# Patient Record
Sex: Male | Born: 1955 | Race: White | Hispanic: No | Marital: Single | Smoking: Current every day smoker
Health system: Southern US, Community
[De-identification: ages and names within clinical notes are randomized; demographics above are authoritative.]

## PROBLEM LIST (undated history)

## (undated) DIAGNOSIS — N179 Acute kidney failure, unspecified: Secondary | ICD-10-CM

## (undated) DIAGNOSIS — M549 Dorsalgia, unspecified: Secondary | ICD-10-CM

## (undated) HISTORY — PX: LEG SURGERY: SHX1003

---

## 2007-11-12 ENCOUNTER — Emergency Department (HOSPITAL_COMMUNITY): Admission: EM | Admit: 2007-11-12 | Discharge: 2007-11-12 | Payer: Self-pay | Admitting: Emergency Medicine

## 2008-05-24 ENCOUNTER — Encounter: Admission: RE | Admit: 2008-05-24 | Discharge: 2008-05-24 | Payer: Self-pay | Admitting: Internal Medicine

## 2012-05-02 ENCOUNTER — Emergency Department (HOSPITAL_COMMUNITY)
Admission: EM | Admit: 2012-05-02 | Discharge: 2012-05-02 | Disposition: A | Discharge status: Home / Self Care | Payer: Medicare Other | Attending: Emergency Medicine | Admitting: Emergency Medicine

## 2012-05-02 ENCOUNTER — Encounter (HOSPITAL_COMMUNITY): Payer: Self-pay | Admitting: Emergency Medicine

## 2012-05-02 ENCOUNTER — Emergency Department (HOSPITAL_COMMUNITY): Payer: Medicare Other

## 2012-05-02 DIAGNOSIS — X58XXXA Exposure to other specified factors, initial encounter: Secondary | ICD-10-CM | POA: Insufficient documentation

## 2012-05-02 DIAGNOSIS — Y93H2 Activity, gardening and landscaping: Secondary | ICD-10-CM | POA: Insufficient documentation

## 2012-05-02 DIAGNOSIS — F17200 Nicotine dependence, unspecified, uncomplicated: Secondary | ICD-10-CM | POA: Insufficient documentation

## 2012-05-02 DIAGNOSIS — M79609 Pain in unspecified limb: Secondary | ICD-10-CM | POA: Insufficient documentation

## 2012-05-02 DIAGNOSIS — M7989 Other specified soft tissue disorders: Secondary | ICD-10-CM

## 2012-05-02 DIAGNOSIS — M79606 Pain in leg, unspecified: Secondary | ICD-10-CM

## 2012-05-02 HISTORY — DX: Dorsalgia, unspecified: M54.9

## 2012-05-02 MED ORDER — HYDROCODONE-ACETAMINOPHEN 5-500 MG PO TABS: 1.0000 | ORAL_TABLET | Freq: Four times a day (QID) | ORAL | Status: AC | PRN

## 2012-05-02 NOTE — ED Notes (Signed)
Patient transported to X-ray 

## 2012-05-02 NOTE — ED Provider Notes (Signed)
History  Scribed for Geoffery Lyons, MD, the patient was seen in room TR06C/TR06C. This chart was scribed by Candelaria Stagers. The patient's care started at 12:21 PM   CSN: [id number]  Arrival date & time 05/02/12  1039   First MD Initiated Contact with Patient 05/02/12 1220      Chief Complaint  Patient presents with  . Leg Injury     The history is provided by the patient. No language interpreter was used.   Orvill Coulthard is a 56 y.o. male who presents to the Emergency Department complaining of left ankle pain that radiates up the lower leg that started after trimming trees yesterday.  He states that he didn't fall and is uncertain that a branch fell on it.  He is experiencing ankle swelling.  Walking and bearing weight makes the pain worse.   Past Medical History  Diagnosis Date  . Back pain     History reviewed. No pertinent past surgical history.  No family history on file.  History  Substance Use Topics  . Smoking status: Current Everyday Smoker  . Smokeless tobacco: Not on file  . Alcohol Use: Yes      Review of Systems  Musculoskeletal: Positive for arthralgias (left ankle pain).  Skin: Negative for wound.  All other systems reviewed and are negative.    Allergies  Penicillins  Home Medications   Current Outpatient Rx  Name Route Sig Dispense Refill  . ALPRAZOLAM 0.5 MG PO TABS Oral Take 0.5 mg by mouth at bedtime.     . CYCLOBENZAPRINE HCL 10 MG PO TABS Oral Take 10 mg by mouth 2 (two) times daily as needed. For muscle spasms.    . MELOXICAM 15 MG PO TABS Oral Take 15 mg by mouth daily as needed. For pain    . OXYMORPHONE HCL ER 40 MG PO TB12 Oral Take 40 mg by mouth every evening.    Marland Kitchen OXYMORPHONE HCL 10 MG PO TABS Oral Take 10 mg by mouth every 4 (four) hours as needed. For pain    . QUETIAPINE FUMARATE 200 MG PO TABS Oral Take 200 mg by mouth at bedtime.      BP 120/90  Pulse 99  Temp 98.3 F (36.8 C) (Oral)  Resp 22  SpO2 97%  Physical Exam    Nursing note and vitals reviewed. Constitutional: He is oriented to person, place, and time. He appears well-developed and well-nourished. No distress.  HENT:  Head: Normocephalic and atraumatic.  Eyes: EOM are normal. Pupils are equal, round, and reactive to light.  Neck: Neck supple. No tracheal deviation present.  Pulmonary/Chest: Effort normal. No respiratory distress.  Musculoskeletal: Normal range of motion. He exhibits no edema.       Swelling of the left ankle and left calf with no ecchymosis.  DP and cap refill intact.    Neurological: He is alert and oriented to person, place, and time.  Skin: Skin is warm and dry.  Psychiatric: He has a normal mood and affect. His behavior is normal.    ED Course  Procedures   DIAGNOSTIC STUDIES: Oxygen Saturation is 97% on room air, normal by my interpretation.    COORDINATION OF CARE:  12:29 Ordered: US Venous Img Lower Unilateral Left 10:57 Ordered: DG Ankle Complete Left    Labs Reviewed - No data to display Dg Ankle Complete Left  05/02/2012  *RADIOLOGY REPORT*  Clinical Data: Left foot pain, and swelling, injury at work  LEFT ANKLE COMPLETE - 3+  VIEW  Comparison: None.  Findings: Diffuse soft tissue swelling about the ankle slightly worse adjacent to the medial malleolus that the lateral.  No evidence of acute fracture, or malalignment.  The ankle mortise remains congruent.  A small ankle joint effusion is present and there is infiltration of Kager's fat pad.  Atherosclerotic vascular calcifications identified in the posterior tibial artery.  IMPRESSION:  1.  Soft tissue swelling about the ankle, and a small ankle joint effusion without evidence of acute fracture, or malalignment.  2.  Atherosclerotic vascular calcifications in the posterior tibial artery distributions.   Original Report Authenticated By: Vilma Prader      No diagnosis found.    MDM  The patient presents with pain and swelling to his left ankle and calf.  He tells me  he was helping a friend trim some trees and believes he may have injured it then.  On exam, the ankle is swollen but distal pulses, sensation, and motor are intact.  The lack of a specific trauma to this area makes me consider dvt in the differential and performed an ultrasound.  This showed only a Baker's cyst but no clot.  I doubt this is the cause of his swelling.  I suspect he aggravated the ankle performing the work he did yesterday and will treat with rest, pain medications, and prn followup if not improving.     I personally performed the services described in this documentation, which was scribed in my presence. The recorded information has been reviewed and considered.         Geoffery Lyons, MD 05/03/12 [phone]

## 2012-05-02 NOTE — ED Notes (Signed)
Patient reports onset of swelling to the left lower extremity x 2 days.  Patient has noted warmth to the extremity,  Swelling noted from the knee to the foot.  Cap refill less than 3,  Contusion noted to the inner foot and outer ankle.  Patient denies sob, denies chest pain

## 2012-05-02 NOTE — Progress Notes (Signed)
VASCULAR LAB PRELIMINARY  PRELIMINARY  PRELIMINARY  PRELIMINARY  Left lower extremity venous Doppler completed.    Preliminary report:  There is no obvious evidence of DVT or SVT noted in the left lower extremity.  There is a moderate to large, intact,  Baker's Cyst noted in the left popliteal fossa.   Stanton Kissoon, 05/02/2012, 2:06 PM

## 2012-05-02 NOTE — ED Notes (Signed)
Pt. Stated, I've had leg and foot pain.  Foot pain from playing with grand, and leg injury helping  Friend.

## 2012-12-17 ENCOUNTER — Encounter (HOSPITAL_COMMUNITY): Payer: Self-pay | Admitting: *Deleted

## 2012-12-17 ENCOUNTER — Emergency Department (INDEPENDENT_AMBULATORY_CARE_PROVIDER_SITE_OTHER)
Admission: EM | Admit: 2012-12-17 | Discharge: 2012-12-17 | Disposition: A | Discharge status: Home / Self Care | Payer: Worker's Compensation | Source: Home / Self Care | Attending: Family Medicine | Admitting: Family Medicine

## 2012-12-17 DIAGNOSIS — M545 Low back pain, unspecified: Secondary | ICD-10-CM

## 2012-12-17 DIAGNOSIS — G8929 Other chronic pain: Secondary | ICD-10-CM

## 2012-12-17 MED ORDER — OXYMORPHONE HCL ER 10 MG PO TB12: 10.0000 mg | ORAL_TABLET | Freq: Two times a day (BID) | ORAL | Status: DC

## 2012-12-17 NOTE — ED Notes (Signed)
Pt   States  He     Goes  To  A  Pain  Control  Clinic  In  [place]           He  States  He lives  In  [place]  He  States  He  Is  Almost  Out  Of  His  Pain pills          He  Reports  Chronic          Back  Pain       He  Ambulated  To  Room  With a  Steady  Fluid  Gait

## 2012-12-17 NOTE — ED Provider Notes (Signed)
History     CSN: [id number]  Arrival date & time 12/17/12  1259   First MD Initiated Contact with Patient 12/17/12 1314      Chief Complaint  Patient presents with  . Back Pain    (Consider location/radiation/quality/duration/timing/severity/associated sxs/prior treatment) Patient is a 57 y.o. male presenting with back pain. The history is provided by the patient.  Back Pain Location:  Lumbar spine Quality:  Aching Radiates to:  Does not radiate Pain severity:  Moderate Chronicity:  Chronic Context comment:  Reports back injury in 2006 with chronic pain since.   Past Medical History  Diagnosis Date  . Back pain     History reviewed. No pertinent past surgical history.  No family history on file.  History  Substance Use Topics  . Smoking status: Current Every Day Smoker  . Smokeless tobacco: Not on file  . Alcohol Use: Yes      Review of Systems  Constitutional: Negative.   Gastrointestinal: Negative.   Genitourinary: Negative for difficulty urinating.  Musculoskeletal: Positive for back pain. Negative for gait problem.    Allergies  Penicillins  Home Medications   Current Outpatient Rx  Name  Route  Sig  Dispense  Refill  . ALPRAZolam (XANAX) 0.5 MG tablet   Oral   Take 0.5 mg by mouth at bedtime.          . cyclobenzaprine (FLEXERIL) 10 MG tablet   Oral   Take 10 mg by mouth 2 (two) times daily as needed. For muscle spasms.         . meloxicam (MOBIC) 15 MG tablet   Oral   Take 15 mg by mouth daily as needed. For pain         . oxymorphone (OPANA ER) 10 MG 12 hr tablet   Oral   Take 1 tablet (10 mg total) by mouth every 12 (twelve) hours. For pain   10 tablet   0   . oxymorphone (OPANA ER) 40 MG 12 hr tablet   Oral   Take 40 mg by mouth every evening.         Marland Kitchen oxymorphone (OPANA) 10 MG tablet   Oral   Take 10 mg by mouth every 4 (four) hours as needed. For pain         . QUEtiapine (SEROQUEL) 200 MG tablet   Oral   Take  200 mg by mouth at bedtime.           BP 155/93  Pulse 90  Temp(Src) 98.3 F (36.8 C) (Oral)  Resp 18  SpO2 99%  Physical Exam  Nursing note and vitals reviewed. Constitutional: He is oriented to person, place, and time. He appears well-developed and well-nourished.  Musculoskeletal: He exhibits no edema.  Neg slr.  Neurological: He is alert and oriented to person, place, and time.  Skin: Skin is warm and dry.    ED Course  Procedures (including critical care time)  Labs Reviewed - No data to display No results found.   1. Chronic lower back pain       MDM          Linna Hoff, MD 12/17/12 1723

## 2012-12-27 ENCOUNTER — Encounter (HOSPITAL_COMMUNITY): Payer: Self-pay

## 2012-12-27 ENCOUNTER — Emergency Department (INDEPENDENT_AMBULATORY_CARE_PROVIDER_SITE_OTHER)
Admission: EM | Admit: 2012-12-27 | Discharge: 2012-12-27 | Disposition: A | Discharge status: Home / Self Care | Payer: Medicare Other | Source: Home / Self Care

## 2012-12-27 DIAGNOSIS — M549 Dorsalgia, unspecified: Secondary | ICD-10-CM

## 2012-12-27 DIAGNOSIS — G8929 Other chronic pain: Secondary | ICD-10-CM

## 2012-12-27 LAB — RAPID URINE DRUG SCREEN, HOSP PERFORMED
Amphetamines: NOT DETECTED
Opiates: NOT DETECTED

## 2012-12-27 NOTE — ED Provider Notes (Signed)
History     CSN: [id number]  Arrival date & time 12/27/12  1700   First MD Initiated Contact with Patient 12/27/12 1708      Chief Complaint  Patient presents with  . Back Pain    HPI 57 year old male with chronic back pain over his lumbar and cervical spine since 2006 following a fall and injuring his back presents to establish new care. Patient was previously followed at [facility] pain clinic in [place] and was discharged from the service as he breached the pain contract in February and given a 1 month supply of medications.  Patient informs having continuous low back and neck pain. He denies radiation of the pain or bowel or urinary symptoms. Denies any fever, chills, headache, blurred vision, chest pain, palpitations, shortness of breath, abdominal pain, nausea or vomiting.  Past Medical History  Diagnosis Date  . Back pain     History reviewed. No pertinent past surgical history.  No family history on file.  History  Substance Use Topics  . Smoking status: Current Every Day Smoker  . Smokeless tobacco: Not on file  . Alcohol Use: Yes      Review of Systems As outlined in history of present illness Allergies  Penicillins  Home Medications   Current Outpatient Rx  Name  Route  Sig  Dispense  Refill  . tadalafil (CIALIS) 20 MG tablet   Oral   Take 20 mg by mouth daily as needed for erectile dysfunction.         Marland Kitchen ALPRAZolam (XANAX) 0.5 MG tablet   Oral   Take 1 mg by mouth 3 (three) times daily as needed.          . cyclobenzaprine (FLEXERIL) 10 MG tablet   Oral   Take 10 mg by mouth 2 (two) times daily as needed. For muscle spasms.         . meloxicam (MOBIC) 15 MG tablet   Oral   Take 15 mg by mouth daily as needed. For pain         . oxymorphone (OPANA ER) 10 MG 12 hr tablet   Oral   Take 1 tablet (10 mg total) by mouth every 12 (twelve) hours. For pain   10 tablet   0   . oxymorphone (OPANA ER) 40 MG 12 hr tablet   Oral   Take  40 mg by mouth every evening.         Marland Kitchen oxymorphone (OPANA) 10 MG tablet   Oral   Take 10 mg by mouth every 4 (four) hours as needed. For pain         . QUEtiapine (SEROQUEL) 200 MG tablet   Oral   Take 200 mg by mouth at bedtime.           BP 175/106  Pulse 105  Temp(Src) 97.7 F (36.5 C) (Oral)  Resp 18  SpO2 99%  Physical Exam Middle-aged male in no acute distress HEENT: No pallor, moist oral mucosa Chest: Clear to auscultation bilaterally, no added sounds CVS: Normal S1 and S2 Abdomen: Soft, nontender, nondistended,  Extremities: Warm, tender over the lower back to palpation, normal range of motion CNS: The aorta is 3 ED Course  Procedures (including critical care time)  Labs Reviewed  URINE RAPID DRUG SCREEN (HOSP PERFORMED)   No results found.   1. Chronic back pain       MDM  Follow up tomorrow with results of urine drug screen. If positive  I will give him a two-week course of the narcotics.  Have made a referral to the adult pain management clinic Counseled strongly on smoking cessation.        Eddie North, MD 12/27/12 1749

## 2012-12-27 NOTE — ED Notes (Signed)
Patient states  Broke his back in 2006 Suffers from chronic back pain

## 2012-12-28 ENCOUNTER — Emergency Department (INDEPENDENT_AMBULATORY_CARE_PROVIDER_SITE_OTHER)
Admission: EM | Admit: 2012-12-28 | Discharge: 2012-12-28 | Disposition: A | Discharge status: Home / Self Care | Payer: Medicare Other | Source: Home / Self Care

## 2012-12-28 ENCOUNTER — Encounter (HOSPITAL_COMMUNITY): Payer: Self-pay

## 2012-12-28 DIAGNOSIS — M545 Low back pain: Secondary | ICD-10-CM

## 2012-12-28 MED ORDER — OXYMORPHONE HCL ER 40 MG PO TB12: 40.0000 mg | ORAL_TABLET | Freq: Every evening | ORAL | Status: DC

## 2012-12-28 MED ORDER — OXYMORPHONE HCL 10 MG PO TABS: 10.0000 mg | ORAL_TABLET | Freq: Four times a day (QID) | ORAL | Status: DC | PRN

## 2012-12-28 NOTE — ED Notes (Signed)
Referral faxed to [facility] for chronic back pain

## 2012-12-28 NOTE — Progress Notes (Signed)
UDS reviewed and was negative for opiates. . Patient unsure when he last took the pain meds.  i will prescribe him with a 2 week supply of oxymorphone 40 mg ER bid and oxymorphone 10 mg IR q 6 hr prn for pain.  referral to pain management clinic has been sent.

## 2012-12-28 NOTE — ED Notes (Signed)
Patient here to get results of urine drug screen

## 2013-01-31 ENCOUNTER — Encounter: Payer: Self-pay | Admitting: Family Medicine

## 2013-01-31 ENCOUNTER — Ambulatory Visit: Payer: Medicare Other | Attending: Family Medicine | Admitting: Family Medicine

## 2013-01-31 VITALS — BP 112/78 | HR 63 | Temp 98.0°F | Resp 14 | Ht 75.0 in | Wt 192.0 lb

## 2013-01-31 DIAGNOSIS — G8929 Other chronic pain: Secondary | ICD-10-CM

## 2013-01-31 DIAGNOSIS — M549 Dorsalgia, unspecified: Secondary | ICD-10-CM

## 2013-01-31 NOTE — Patient Instructions (Signed)
*   [facility] (helps with withdrawal): [address] [place], [place], [place] [address] [phone]

## 2013-01-31 NOTE — Progress Notes (Signed)
Subjective:     Patient ID: Dale [name], male   DOB: 1956/04/04, 57 y.o.   MRN: [id number]  HPI Pt here for refills on narcotics for his chronic back pain. Per notes, he was discharged 10/08/12 from a pain management clinic in [place] due to breaching the contract. At that time he was given a one month supply of medication. Last month he was referred to a pain clinic here and given a refill on the narcotics. He has not heard anything about the referral and plans on having his narcotics filled here.  He continues to have chronic back pain - 10/10 and constant per him. No loss of bladder/bowel function or other neuro deficits. No new sx.    Review of Systems per hpi     Objective:   Physical Exampt declined PE     Assessment:     Chronic back pain - Plan: Ambulatory referral to Pain Clinic       Plan:     Explained that, as told to him previously, we do not manage chronic pain here and we will not prescribe chronic narcotics here. I have re-placed the pain clinic referral as I do not see where the pt was either scheduled or denied.  Have given the pt information on [facility] to help him if he experiences withdrawal sx. Emphasized we are more than happy to have him as a pt here and to manage his other health and wellness needs, but we will not be managing the chronic pain or prescribing pain meds. Pt verbalized unhappines with this information, however he did verbalize understanding.    Should he experience severe w/d sx he should go to the ED. Should he still like to be our patient for other health and wellness issues, he should f/u in 1-2 mos to discuss other health issues as well as for health maintanance screenings. He will call with any concerns or questions.

## 2013-01-31 NOTE — Progress Notes (Signed)
Patient states he is here for medication refill for oxymorphone due to back and shoulder pain; explained that he would need to be referred to pain management for more pain medication.

## 2013-03-06 ENCOUNTER — Emergency Department (HOSPITAL_COMMUNITY): Payer: Medicare Other

## 2013-03-06 ENCOUNTER — Encounter (HOSPITAL_COMMUNITY): Payer: Self-pay | Admitting: *Deleted

## 2013-03-06 ENCOUNTER — Emergency Department (EMERGENCY_DEPARTMENT_HOSPITAL)
Admission: EM | Admit: 2013-03-06 | Discharge: 2013-03-06 | Disposition: A | Discharge status: Home / Self Care | Payer: Medicare Other | Source: Home / Self Care | Attending: Emergency Medicine | Admitting: Emergency Medicine

## 2013-03-06 DIAGNOSIS — F1910 Other psychoactive substance abuse, uncomplicated: Secondary | ICD-10-CM

## 2013-03-06 DIAGNOSIS — R9089 Other abnormal findings on diagnostic imaging of central nervous system: Secondary | ICD-10-CM

## 2013-03-06 DIAGNOSIS — R930 Abnormal findings on diagnostic imaging of skull and head, not elsewhere classified: Secondary | ICD-10-CM

## 2013-03-06 DIAGNOSIS — R4182 Altered mental status, unspecified: Secondary | ICD-10-CM

## 2013-03-06 LAB — CBC
Hemoglobin: 14.1 g/dL (ref 13.0–17.0)
MCH: 31.9 pg (ref 26.0–34.0)
MCV: 93 fL (ref 78.0–100.0)
Platelets: 174 10*3/uL (ref 150–400)
RBC: 4.42 MIL/uL (ref 4.22–5.81)
WBC: 6.2 10*3/uL (ref 4.0–10.5)

## 2013-03-06 LAB — COMPREHENSIVE METABOLIC PANEL
ALT: 40 U/L (ref 0–53)
AST: 103 U/L — ABNORMAL HIGH (ref 0–37)
CO2: 25 mEq/L (ref 19–32)
Chloride: 102 mEq/L (ref 96–112)
Creatinine, Ser: 1.03 mg/dL (ref 0.50–1.35)
GFR calc Af Amer: >90 mL/min (ref >90)
GFR calc non Af Amer: 79 mL/min — ABNORMAL LOW (ref >90)
Glucose, Bld: 104 mg/dL — ABNORMAL HIGH (ref 70–99)
Sodium: 141 mEq/L (ref 135–145)
Total Bilirubin: 0.9 mg/dL (ref 0.3–1.2)

## 2013-03-06 LAB — URINALYSIS, ROUTINE W REFLEX MICROSCOPIC
Hgb urine dipstick: NEGATIVE
Nitrite: NEGATIVE
Protein, ur: NEGATIVE mg/dL
Urobilinogen, UA: 1 mg/dL (ref 0.0–1.0)

## 2013-03-06 LAB — RAPID URINE DRUG SCREEN, HOSP PERFORMED
Amphetamines: NOT DETECTED
Barbiturates: NOT DETECTED
Benzodiazepines: NOT DETECTED
Cocaine: NOT DETECTED
Tetrahydrocannabinol: NOT DETECTED

## 2013-03-06 NOTE — ED Notes (Signed)
Pt taken to MRI  

## 2013-03-06 NOTE — ED Provider Notes (Signed)
History    CSN: [id number] Arrival date & time 03/06/13  4098  First MD Initiated Contact with Patient 03/06/13 [phone]     Chief Complaint  Patient presents with  . Altered Mental Status   (Consider location/radiation/quality/duration/timing/severity/associated sxs/prior Treatment) HPI Comments: 57 y/o comes in with cc of AMS. Pt has hx of depression? On seroquel and chronic back pain on opana. Pt was discharged from the pain clinic, and has been taking his opana, rationing it, weaning himself off of it. Last use was 2 weeks ago. Pt is now having visual and auditory hallucinations, and sometimes behaving bizarrely - and family endorses those symptoms. No SI/HI. No hx of same  Patient is a 57 y.o. male presenting with altered mental status. The history is provided by the patient.  Altered Mental Status Associated symptoms: hallucinations   Associated symptoms: no abdominal pain    Past Medical History  Diagnosis Date  . Back pain    History reviewed. No pertinent past surgical history. No family history on file. History  Substance Use Topics  . Smoking status: Current Every Day Smoker  . Smokeless tobacco: Not on file  . Alcohol Use: Yes    Review of Systems  Constitutional: Negative for activity change and appetite change.  Respiratory: Negative for cough and shortness of breath.   Cardiovascular: Negative for chest pain.  Gastrointestinal: Negative for abdominal pain.  Genitourinary: Negative for dysuria.  Psychiatric/Behavioral: Positive for hallucinations and altered mental status. Negative for suicidal ideas and self-injury.    Allergies  Penicillins  Home Medications   Current Outpatient Rx  Name  Route  Sig  Dispense  Refill  . ALPRAZolam (XANAX) 0.5 MG tablet   Oral   Take 1 mg by mouth 3 (three) times daily as needed.          . cyclobenzaprine (FLEXERIL) 10 MG tablet   Oral   Take 10 mg by mouth 2 (two) times daily as needed. For muscle spasms.         . meloxicam (MOBIC) 15 MG tablet   Oral   Take 15 mg by mouth daily as needed. For pain         . oxymorphone (OPANA ER) 10 MG 12 hr tablet   Oral   Take 1 tablet (10 mg total) by mouth every 12 (twelve) hours. For pain   10 tablet   0   . oxymorphone (OPANA ER) 40 MG 12 hr tablet   Oral   Take 1 tablet (40 mg total) by mouth every evening.   30 tablet   0   . oxymorphone (OPANA) 10 MG tablet   Oral   Take 1 tablet (10 mg total) by mouth every 6 (six) hours as needed for pain.   60 tablet   0   . QUEtiapine (SEROQUEL) 200 MG tablet   Oral   Take 200 mg by mouth at bedtime.         . tadalafil (CIALIS) 20 MG tablet   Oral   Take 20 mg by mouth daily as needed for erectile dysfunction.          BP 136/87  Pulse 89  Temp(Src) 98.3 F (36.8 C) (Oral)  Resp 17  SpO2 99% Physical Exam  Nursing note and vitals reviewed. Constitutional: He appears well-developed.  HENT:  Head: Normocephalic and atraumatic.  Eyes: Conjunctivae and EOM are normal. Pupils are equal, round, and reactive to light.  Neck: Normal range of motion. Neck  supple.  Cardiovascular: Normal rate and regular rhythm.   Pulmonary/Chest: Effort normal and breath sounds normal.  Abdominal: Soft. Bowel sounds are normal. He exhibits no distension. There is no tenderness. There is no rebound and no guarding.  Neurological: He is alert.  Cerebellar exam is normal (finger to nose) Sensory exam normal for bilateral upper and lower extremities - and patient is able to discriminate between sharp and dull. Motor exam is 4+/5   Skin: Skin is warm.  Psychiatric: He has a normal mood and affect. His behavior is normal. Judgment and thought content normal.    ED Course  Procedures (including critical care time) Labs Reviewed  COMPREHENSIVE METABOLIC PANEL - Abnormal; Notable for the following:    Glucose, Bld 104 (*)    BUN 29 (*)    AST 103 (*)    GFR calc non Af Amer 79 (*)    All other  components within normal limits  URINALYSIS, ROUTINE W REFLEX MICROSCOPIC - Abnormal; Notable for the following:    APPearance HAZY (*)    Bilirubin Urine SMALL (*)    All other components within normal limits  GLUCOSE, CAPILLARY - Abnormal; Notable for the following:    Glucose-Capillary 101 (*)    All other components within normal limits  CBC  URINE RAPID DRUG SCREEN (HOSP PERFORMED)  POCT I-STAT TROPONIN I   Ct Head Wo Contrast  03/06/2013   *RADIOLOGY REPORT*  Clinical Data: Confusion.  CT HEAD WITHOUT CONTRAST  Technique:  Contiguous axial images were obtained from the base of the skull through the vertex without contrast.  Comparison: None.  Findings: No intracranial hemorrhage.  No hydrocephalus.  7 mm hypodensity right frontal lobe.  Etiology indeterminate.  This may be related to prior ischemia, small acute infarct or partial volume averaging with adjacent sulcus.  Mass felt to be less likely consideration.  If there are persistent/progressive symptoms this can be further evaluated with MR.  Tiny radiopaque structures felt to be related to artifact.  Carotid artery calcifications.  Partial opacification inferior aspect right mastoid air cells.  Visualized orbital structures unremarkable.  IMPRESSION: 7 mm hypodensity right frontal lobe.  Etiology indeterminate.  This may be related to prior ischemia, small acute infarct or partial volume averaging with adjacent sulcus.  Mass felt to be less likely consideration.  If there are persistent/progressive symptoms this can be further evaluated with MR.  Partial opacification inferior aspect right mastoid air cells.   Original Report Authenticated By: Lacy Duverney, M.D.   No diagnosis found.  MDM  Pt comes in with some psychoses. No hx of psychotic condition. Opana stopped 2 weeks ago dont suspect that as the cause. No alcohol abuse. CT showed hypodense area, MRI shows no acute pathology. Neuro and psych were recommended, and when cleared,  patient discharged.   Derwood Kaplan, MD 03/08/13 [phone]

## 2013-03-06 NOTE — ED Notes (Signed)
Pt has been taking opana (large doses), flexeril, xanax for chronic back pain x 7 yrs. Was released from a pain clinic in [place] back in May & has been unable to get into another pain management clinic. Last opana dose approx week ago.  Has been receiving prescriptions of xanax & flexeril from psychiatrist, Dr. Darlys Gales. Family & pt reports confusion, acting strange, auditory & visual hallucinations past week. States they are all people that he knows, such as friends or family. Has not informed Dr. Darlys Gales of hallucinations. Denies HI, SI.  Pt very restless, anxious, jittery.

## 2013-03-06 NOTE — ED Notes (Signed)
Consulting MD, Dr. Amada Jupiter at bedside.

## 2013-03-06 NOTE — ED Notes (Signed)
Diet tray ordered 

## 2013-03-06 NOTE — ED Provider Notes (Signed)
8:52 PM The patient was seen and evaluated by psychiatry who believes the patient is stable from a mental health standpoint.  He does not believe the patient is a threat to himself or others.  MRI is without acute abnormality.  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  Discharge home in good condition.  Lyanne Co, MD 03/06/13 2053

## 2013-03-06 NOTE — ED Notes (Signed)
Ordered a Reg tray

## 2013-03-06 NOTE — ED Notes (Signed)
Patient transported to CT 

## 2013-03-06 NOTE — ED Notes (Signed)
Pt is here with confusion and seems anxious.  Pt has been having trouble with speech and stuttering.  Pt was on pain meds for broken back and then was unable to get medications.  Pt went to Dr. Darlys Gales a psychiatrist.  Confusing dreams with reality and seeing things that are not there.  Pt has been off the pain medication for 2-3 weeks.   Pt was taken in by police yesterday because they thought he was under the influence, but he was not per brother.  Pt knows it is 2014 and thought it was Monday.

## 2013-03-06 NOTE — Consult Note (Signed)
Reason for Consult: Confusion with an abnormal head CT Referring Physician: Dr Patria Mane  CC: Confusion  HPI: Dale [name] is an 57 y.o. male who suffered a back injury as the result of a fall in 2006. He has had chronic back pain since that time which has required significant amounts of narcotic pain medications. The patient was followed by the [facility] pain clinic in [place] until just recently when they dismissed him from the practice secondary to a disagreement regarding his medications. The patient lives alone and is normally independent and drives. He is assisted occasionally by his sister and her husband. They have not seen the patient until  2 days ago. At that time he appeared to be very anxious, confused, and had difficulty speaking. The patient stated that he ran out of his Opana a week ago and has had no pain medications since that time. He also takes Flexeril, Mobic, Xanax, Cialis, and Seroquel for sleep. The Seroquel was prescribed by the patient's psychiatrist. The patient was brought to the emergency department at [facility] today by his sister and her husband when they realized that the patient was having visual and auditory hallucinations. A CT scan was performed that revealed a 7 mm hypodensity in the right frontal lobe the etiology of which was unclear. A neurology consult was requested for further evaluation.  He seems slightly less "anxious" today.   Past Medical History  Diagnosis Date  . Back pain     History reviewed. No pertinent past surgical history.  No family history on file.  Social History:  The patient smokes approximately 3/4 of a pack cigarettes per day. He usually has 2-3 beers for other alcoholic drinks per day. He drives and lives independently.  Allergies  Allergen Reactions  . Penicillins Hives    Medications: No current facility-administered medications for this encounter. Current outpatient prescriptions:ALPRAZolam (XANAX) 0.5 MG  tablet, Take 1 mg by mouth 3 (three) times daily as needed. , Disp: , Rfl: ;  cyclobenzaprine (FLEXERIL) 10 MG tablet, Take 10 mg by mouth 2 (two) times daily as needed. For muscle spasms., Disp: , Rfl: ;  meloxicam (MOBIC) 15 MG tablet, Take 15 mg by mouth daily as needed. For pain, Disp: , Rfl:  oxymorphone (OPANA ER) 10 MG 12 hr tablet, Take 1 tablet (10 mg total) by mouth every 12 (twelve) hours. For pain, Disp: 10 tablet, Rfl: 0;  oxymorphone (OPANA ER) 40 MG 12 hr tablet, Take 1 tablet (40 mg total) by mouth every evening., Disp: 30 tablet, Rfl: 0;  oxymorphone (OPANA) 10 MG tablet, Take 1 tablet (10 mg total) by mouth every 6 (six) hours as needed for pain., Disp: 60 tablet, Rfl: 0 QUEtiapine (SEROQUEL) 200 MG tablet, Take 200 mg by mouth at bedtime., Disp: , Rfl: ;  tadalafil (CIALIS) 20 MG tablet, Take 20 mg by mouth daily as needed for erectile dysfunction., Disp: , Rfl:   ROS: Unobtainable secondary to confusion.  Physical Examination: Blood pressure 132/91, pulse 89, temperature 98.3 F (36.8 C), temperature source Oral, resp. rate 19, SpO2 97.00%.  Neurologic Examination General - restless 58 year old male, at times mumbling, who appears uncomfortable. Heart - Regular rate and rhythm - no murmer Lungs - Clear to auscultation Abdomen - Soft - non tender Extremities - Distal pulses intact - no edema Skin - Warm and dry  Mental Status: Alert, somewhat anxious, with slightly pressure speech. Oriented with cueing. Able to follow 3 step commands without difficulty. Able to spell  world backwards without difficulty. Unable to perform serial 7s. Able to give # of quarters in $2.75 Cranial Nerves: II: Discs not visualized; Visual fields grossly normal, pupils equal, round, reactive to light and accommodation dilated. III,IV, VI: ptosis not present, extra-ocular motions intact bilaterally V,VII: smile symmetric, facial light touch sensation normal bilaterally VIII: hearing normal  bilaterally IX,X: gag reflex present XI: bilateral shoulder shrug XII: midline tongue extension Motor: Right : Upper extremity   5/5    Left:     Upper extremity   5/5  Lower extremity   5/5     Lower extremity   5/5 Tone and bulk:normal tone throughout; no atrophy noted He has significant psychomotor agitation.  Sensory: Light touch intact throughout, bilaterally Deep Tendon Reflexes: 1+ and symmetric throughout Plantars: Right: downgoing   Left: downgoing Cerebellar: Finger to nose performed with significant tremor bilaterally. Heel-to-shin with some difficulty but intact. Gait: Deferred CV: pulses palpable throughout   Laboratory Studies:   Basic Metabolic Panel:  Recent Labs Lab 03/06/13 0959  NA 141  K 3.6  CL 102  CO2 25  GLUCOSE 104*  BUN 29*  CREATININE 1.03  CALCIUM 9.6    Liver Function Tests:  Recent Labs Lab 03/06/13 0959  AST 103*  ALT 40  ALKPHOS 67  BILITOT 0.9  PROT 7.0  ALBUMIN 4.0   No results found for this basename: LIPASE, AMYLASE,  in the last 168 hours No results found for this basename: AMMONIA,  in the last 168 hours  CBC:  Recent Labs Lab 03/06/13 0959  WBC 6.2  HGB 14.1  HCT 41.1  MCV 93.0  PLT 174    Cardiac Enzymes: No results found for this basename: CKTOTAL, CKMB, CKMBINDEX, TROPONINI,  in the last 168 hours  BNP: No components found with this basename: POCBNP,   CBG:  Recent Labs Lab 03/06/13 0934  GLUCAP 101*    Microbiology: No results found for this or any previous visit.  Coagulation Studies: No results found for this basename: LABPROT, INR,  in the last 72 hours  Urinalysis:  Recent Labs Lab 03/06/13 1041  COLORURINE YELLOW  LABSPEC 1.029  PHURINE 6.0  GLUCOSEU NEGATIVE  HGBUR NEGATIVE  BILIRUBINUR SMALL*  KETONESUR NEGATIVE  PROTEINUR NEGATIVE  UROBILINOGEN 1.0  NITRITE NEGATIVE  LEUKOCYTESUR NEGATIVE    Lipid Panel:  No results found for this basename: chol, trig, hdl, cholhdl,  vldl, ldlcalc    HgbA1C:  No results found for this basename: HGBA1C    Urine Drug Screen:     Component Value Date/Time   LABOPIA NONE DETECTED 03/06/2013 1041   COCAINSCRNUR NONE DETECTED 03/06/2013 1041   LABBENZ NONE DETECTED 03/06/2013 1041   AMPHETMU NONE DETECTED 03/06/2013 1041   THCU NONE DETECTED 03/06/2013 1041   LABBARB NONE DETECTED 03/06/2013 1041    Alcohol Level: No results found for this basename: ETH,  in the last 168 hours  Other results: EKG: Sinus tachycardia rate 102 beats per minute  Imaging:  Ct Head Wo Contrast 03/06/2013  7 mm hypodensity right frontal lobe.  Etiology indeterminate.  This may be related to prior ischemia, small acute infarct or partial volume averaging with adjacent sulcus.  Mass felt to be less likely consideration.  If there are persistent/progressive symptoms this can be further evaluated with MR.  Partial opacification inferior aspect right mastoid air cells.     I have seen and evaluated the patient. I have reviewed the above note and made appropriate changes.  Assessment/Plan:  This is a 57 year old male with a history of chronic back pain and opioid dependency who was brought to the emergency department by his family for confusion, hallucinations, and difficulty with his speech. A CT scan showed a 7 mm hypodensity in the right frontal lobe. The etiology is unclear. This may simply be artifact; however, it was felt that an MRI would be required for further evaluation. It is believed at this time that the patient's current symptoms are the result of narcotic withdrawal, though he may have some alcohol abuse that he is not reporting. At this time, I feel that further evaluation of his brain lesion with MRI would be prudent, but the recent cessation of narcotics coupled with his psychomotor agitation and delusions make me think taht a withdrawal syndrome is most likely.    Ritta Slot, MD [facility] [phone]  If  7pm- 7am, please page neurology on call at [phone].

## 2013-03-08 ENCOUNTER — Encounter (HOSPITAL_COMMUNITY): Payer: Self-pay

## 2013-03-08 ENCOUNTER — Inpatient Hospital Stay (HOSPITAL_COMMUNITY)
Admission: EM | Admit: 2013-03-08 | Discharge: 2013-03-10 | DRG: 896 | Disposition: A | Discharge status: Other Acute Inpatient Hospital | Payer: Medicare Other | Attending: Internal Medicine | Admitting: Internal Medicine

## 2013-03-08 DIAGNOSIS — F17200 Nicotine dependence, unspecified, uncomplicated: Secondary | ICD-10-CM | POA: Diagnosis present

## 2013-03-08 DIAGNOSIS — Z880 Allergy status to penicillin: Secondary | ICD-10-CM

## 2013-03-08 DIAGNOSIS — Z79899 Other long term (current) drug therapy: Secondary | ICD-10-CM

## 2013-03-08 DIAGNOSIS — E440 Moderate protein-calorie malnutrition: Secondary | ICD-10-CM | POA: Diagnosis present

## 2013-03-08 DIAGNOSIS — F39 Unspecified mood [affective] disorder: Secondary | ICD-10-CM | POA: Diagnosis present

## 2013-03-08 DIAGNOSIS — F1120 Opioid dependence, uncomplicated: Secondary | ICD-10-CM | POA: Diagnosis present

## 2013-03-08 DIAGNOSIS — F19939 Other psychoactive substance use, unspecified with withdrawal, unspecified: Principal | ICD-10-CM | POA: Diagnosis present

## 2013-03-08 DIAGNOSIS — R4182 Altered mental status, unspecified: Secondary | ICD-10-CM

## 2013-03-08 DIAGNOSIS — D72829 Elevated white blood cell count, unspecified: Secondary | ICD-10-CM | POA: Diagnosis present

## 2013-03-08 DIAGNOSIS — IMO0002 Reserved for concepts with insufficient information to code with codable children: Secondary | ICD-10-CM

## 2013-03-08 DIAGNOSIS — G894 Chronic pain syndrome: Secondary | ICD-10-CM | POA: Diagnosis present

## 2013-03-08 DIAGNOSIS — N179 Acute kidney failure, unspecified: Secondary | ICD-10-CM | POA: Diagnosis present

## 2013-03-08 DIAGNOSIS — J329 Chronic sinusitis, unspecified: Secondary | ICD-10-CM | POA: Diagnosis present

## 2013-03-08 DIAGNOSIS — G9340 Encephalopathy, unspecified: Secondary | ICD-10-CM | POA: Diagnosis present

## 2013-03-08 HISTORY — DX: Acute kidney failure, unspecified: N17.9

## 2013-03-08 LAB — CBC
Hemoglobin: 14.6 g/dL (ref 13.0–17.0)
MCH: 32.3 pg (ref 26.0–34.0)
MCV: 94.2 fL (ref 78.0–100.0)
RBC: 4.52 MIL/uL (ref 4.22–5.81)
WBC: 13.8 10*3/uL — ABNORMAL HIGH (ref 4.0–10.5)

## 2013-03-08 LAB — COMPREHENSIVE METABOLIC PANEL
ALT: 53 U/L (ref 0–53)
CO2: 22 mEq/L (ref 19–32)
Calcium: 10.1 mg/dL (ref 8.4–10.5)
Chloride: 101 mEq/L (ref 96–112)
GFR calc Af Amer: 24 mL/min — ABNORMAL LOW (ref >90)
GFR calc non Af Amer: 21 mL/min — ABNORMAL LOW (ref >90)
Glucose, Bld: 110 mg/dL — ABNORMAL HIGH (ref 70–99)
Sodium: 141 mEq/L (ref 135–145)
Total Bilirubin: 0.7 mg/dL (ref 0.3–1.2)

## 2013-03-08 LAB — URINALYSIS, ROUTINE W REFLEX MICROSCOPIC
Leukocytes, UA: NEGATIVE
Nitrite: NEGATIVE
Protein, ur: 30 mg/dL — AB
Specific Gravity, Urine: 1.029 (ref 1.005–1.030)
Urobilinogen, UA: 0.2 mg/dL (ref 0.0–1.0)

## 2013-03-08 LAB — URINE MICROSCOPIC-ADD ON

## 2013-03-08 LAB — RAPID URINE DRUG SCREEN, HOSP PERFORMED
Amphetamines: NOT DETECTED
Barbiturates: NOT DETECTED
Opiates: NOT DETECTED
Tetrahydrocannabinol: NOT DETECTED

## 2013-03-08 MED ORDER — SODIUM CHLORIDE 0.9 % IV BOLUS (SEPSIS)
1000.0000 mL | Freq: Once | INTRAVENOUS | Status: AC
  Administered 2013-03-08: 1000 mL via INTRAVENOUS

## 2013-03-08 MED ORDER — ONDANSETRON HCL 4 MG PO TABS: 4.0000 mg | ORAL_TABLET | Freq: Four times a day (QID) | ORAL | Status: DC | PRN

## 2013-03-08 MED ORDER — DOCUSATE SODIUM 100 MG PO CAPS
100.0000 mg | ORAL_CAPSULE | Freq: Two times a day (BID) | ORAL | Status: DC
  Administered 2013-03-08 – 2013-03-10 (×4): 100 mg via ORAL
  Filled 2013-03-08 (×5): qty 1

## 2013-03-08 MED ORDER — SODIUM CHLORIDE 0.9 % IJ SOLN
3.0000 mL | Freq: Two times a day (BID) | INTRAMUSCULAR | Status: DC
  Administered 2013-03-09: 3 mL via INTRAVENOUS

## 2013-03-08 MED ORDER — HEPARIN SODIUM (PORCINE) 5000 UNIT/ML IJ SOLN
5000.0000 [IU] | Freq: Three times a day (TID) | INTRAMUSCULAR | Status: DC
  Administered 2013-03-09: 5000 [IU] via SUBCUTANEOUS
  Filled 2013-03-08 (×8): qty 1

## 2013-03-08 MED ORDER — ALPRAZOLAM 1 MG PO TABS
1.0000 mg | ORAL_TABLET | Freq: Three times a day (TID) | ORAL | Status: DC | PRN
  Filled 2013-03-08: qty 1

## 2013-03-08 MED ORDER — CIPROFLOXACIN IN D5W 400 MG/200ML IV SOLN
400.0000 mg | Freq: Two times a day (BID) | INTRAVENOUS | Status: DC
  Administered 2013-03-08 – 2013-03-09 (×3): 400 mg via INTRAVENOUS
  Filled 2013-03-08 (×3): qty 200

## 2013-03-08 MED ORDER — QUETIAPINE FUMARATE 200 MG PO TABS
200.0000 mg | ORAL_TABLET | Freq: Every day | ORAL | Status: DC
  Administered 2013-03-08 – 2013-03-09 (×2): 200 mg via ORAL
  Filled 2013-03-08 (×3): qty 1

## 2013-03-08 MED ORDER — CYCLOBENZAPRINE HCL 10 MG PO TABS
10.0000 mg | ORAL_TABLET | Freq: Two times a day (BID) | ORAL | Status: DC | PRN
  Administered 2013-03-09: 10 mg via ORAL
  Filled 2013-03-08: qty 1

## 2013-03-08 MED ORDER — DEXTROSE-NACL 5-0.45 % IV SOLN
INTRAVENOUS | Status: DC
  Administered 2013-03-08: 17:00:00 via INTRAVENOUS

## 2013-03-08 MED ORDER — VITAMIN B-1 100 MG PO TABS
100.0000 mg | ORAL_TABLET | Freq: Every day | ORAL | Status: DC
  Administered 2013-03-08 – 2013-03-10 (×3): 100 mg via ORAL
  Filled 2013-03-08 (×3): qty 1

## 2013-03-08 MED ORDER — HYDROCODONE-ACETAMINOPHEN 5-325 MG PO TABS: 1.0000 | ORAL_TABLET | ORAL | Status: DC | PRN

## 2013-03-08 MED ORDER — FOLIC ACID 1 MG PO TABS
1.0000 mg | ORAL_TABLET | Freq: Every day | ORAL | Status: DC
  Administered 2013-03-08 – 2013-03-10 (×3): 1 mg via ORAL
  Filled 2013-03-08 (×3): qty 1

## 2013-03-08 MED ORDER — ONDANSETRON HCL 4 MG/2ML IJ SOLN: 4.0000 mg | Freq: Four times a day (QID) | INTRAMUSCULAR | Status: DC | PRN

## 2013-03-08 NOTE — ED Notes (Signed)
States pt has ETOH

## 2013-03-08 NOTE — ED Provider Notes (Signed)
History    CSN: [id number] Arrival date & time 03/08/13  1029  First MD Initiated Contact with Patient 03/08/13 1042     Chief Complaint  Patient presents with  . Medical Clearance   (Consider location/radiation/quality/duration/timing/severity/associated sxs/prior Treatment) Patient is a 57 y.o. male presenting with mental health disorder. The history is provided by the patient.  Mental Health Problem Presenting symptoms: bizarre behavior   Presenting symptoms: no homicidal ideas and no suicidal thoughts   Patient accompanied by:  Family member Degree of incapacity (severity):  Moderate Onset quality:  Gradual Timing:  Constant Progression:  Unchanged Chronicity:  New Context: drug abuse (chronic narcotic use, has now been discontinued due to inability to access his opana.  ) and noncompliance   Relieved by:  Nothing Worsened by:  Nothing tried Associated symptoms: no abdominal pain, no anhedonia, no anxiety, no chest pain and no feelings of worthlessness   Risk factors: hx of mental illness    Past Medical History  Diagnosis Date  . Back pain    History reviewed. No pertinent past surgical history. No family history on file. History  Substance Use Topics  . Smoking status: Current Every Day Smoker  . Smokeless tobacco: Not on file  . Alcohol Use: Yes    Review of Systems  Constitutional: Negative for fever.  HENT: Negative for congestion.   Respiratory: Negative for cough and shortness of breath.   Cardiovascular: Negative for chest pain.  Gastrointestinal: Negative for nausea, vomiting, abdominal pain and diarrhea.  Psychiatric/Behavioral: Negative for suicidal ideas and homicidal ideas. The patient is not nervous/anxious.   All other systems reviewed and are negative.    Allergies  Penicillins  Home Medications   Current Outpatient Rx  Name  Route  Sig  Dispense  Refill  . ALPRAZolam (XANAX) 0.5 MG tablet   Oral   Take 1 mg by mouth 3 (three) times  daily as needed.          . cyclobenzaprine (FLEXERIL) 10 MG tablet   Oral   Take 10 mg by mouth 2 (two) times daily as needed. For muscle spasms.         . meloxicam (MOBIC) 15 MG tablet   Oral   Take 15 mg by mouth daily as needed. For pain         . oxymorphone (OPANA ER) 10 MG 12 hr tablet   Oral   Take 10 mg by mouth every 4 (four) hours as needed (break through pain). For pain         . oxymorphone (OPANA ER) 40 MG 12 hr tablet   Oral   Take 40 mg by mouth every 12 (twelve) hours.         Marland Kitchen QUEtiapine (SEROQUEL) 200 MG tablet   Oral   Take 200 mg by mouth at bedtime.         . tadalafil (CIALIS) 20 MG tablet   Oral   Take 20 mg by mouth daily as needed for erectile dysfunction.          BP 131/89  Pulse 114  Temp(Src) 98.1 F (36.7 C) (Oral)  Resp 16  SpO2 98% Physical Exam  Nursing note and vitals reviewed. Constitutional: He is oriented to person, place, and time. He appears well-developed and well-nourished. No distress.  HENT:  Head: Normocephalic and atraumatic.  Mouth/Throat: Oropharynx is clear and moist.  Eyes: Conjunctivae are normal. No scleral icterus.  Neck: Neck supple.  Cardiovascular: Normal rate,  regular rhythm and intact distal pulses.   Pulmonary/Chest: Effort normal. No stridor. No respiratory distress.  Abdominal: Soft. He exhibits no distension. There is no tenderness.  Musculoskeletal: Normal range of motion. He exhibits no edema.  Neurological: He is alert and oriented to person, place, and time.  Skin: Skin is warm and dry. No rash noted.  Psychiatric: He has a normal mood and affect. His speech is tangential. He is hyperactive. He expresses no suicidal ideation. He expresses no suicidal plans.    ED Course  Procedures (including critical care time) Labs Reviewed  CBC - Abnormal; Notable for the following:    WBC 13.8 (*)    All other components within normal limits  COMPREHENSIVE METABOLIC PANEL - Abnormal; Notable  for the following:    Glucose, Bld 110 (*)    BUN 54 (*)    Creatinine, Ser 3.13 (*)    AST 116 (*)    GFR calc non Af Amer 21 (*)    GFR calc Af Amer 24 (*)    All other components within normal limits  SALICYLATE LEVEL - Abnormal; Notable for the following:    Salicylate Lvl <2.0 (*)    All other components within normal limits  URINE RAPID DRUG SCREEN (HOSP PERFORMED) - Abnormal; Notable for the following:    Benzodiazepines POSITIVE (*)    All other components within normal limits  URINALYSIS, ROUTINE W REFLEX MICROSCOPIC - Abnormal; Notable for the following:    Color, Urine AMBER (*)    APPearance CLOUDY (*)    Hgb urine dipstick LARGE (*)    Bilirubin Urine SMALL (*)    Protein, ur 30 (*)    All other components within normal limits  BASIC METABOLIC PANEL - Abnormal; Notable for the following:    Glucose, Bld 121 (*)    BUN 38 (*)    GFR calc non Af Amer 59 (*)    GFR calc Af Amer 68 (*)    All other components within normal limits  CBC - Abnormal; Notable for the following:    RBC 4.09 (*)    HCT 37.9 (*)    All other components within normal limits  URINE MICROSCOPIC-ADD ON - Abnormal; Notable for the following:    Squamous Epithelial / LPF FEW (*)    Casts HYALINE CASTS (*)    All other components within normal limits  ACETAMINOPHEN LEVEL  ETHANOL  TSH  VITAMIN B12   Ct Head Wo Contrast  03/06/2013   *RADIOLOGY REPORT*  Clinical Data: Confusion.  CT HEAD WITHOUT CONTRAST  Technique:  Contiguous axial images were obtained from the base of the skull through the vertex without contrast.  Comparison: None.  Findings: No intracranial hemorrhage.  No hydrocephalus.  7 mm hypodensity right frontal lobe.  Etiology indeterminate.  This may be related to prior ischemia, small acute infarct or partial volume averaging with adjacent sulcus.  Mass felt to be less likely consideration.  If there are persistent/progressive symptoms this can be further evaluated with MR.  Tiny  radiopaque structures felt to be related to artifact.  Carotid artery calcifications.  Partial opacification inferior aspect right mastoid air cells.  Visualized orbital structures unremarkable.  IMPRESSION: 7 mm hypodensity right frontal lobe.  Etiology indeterminate.  This may be related to prior ischemia, small acute infarct or partial volume averaging with adjacent sulcus.  Mass felt to be less likely consideration.  If there are persistent/progressive symptoms this can be further evaluated with MR.  Partial opacification inferior aspect right mastoid air cells.   Original Report Authenticated By: Lacy Duverney, M.D.   Mr Brain Wo Contrast  03/06/2013   *RADIOLOGY REPORT*  Clinical Data: Altered mental status.  Followup head CT low density.  MRI HEAD WITHOUT CONTRAST  Technique:  Multiplanar, multiecho pulse sequences of the brain and surrounding structures were obtained according to standard protocol without intravenous contrast.  Comparison: Head CT from earlier the same day.  Findings:  Calvarium and upper cervical spine: No marrow signal abnormality.  Orbits: No significant findings.  Sinuses: Clear paranasal sinuses. Right mastoid effusion or mucosal thickening.  No suspicious abnormality in the nasopharynx.  Brain: No acute abnormality such as acute infarct, hemorrhage, hydrocephalus, or mass lesion.  No evidence of large vessel occlusion.Scattered cerebral white matter T2 hyperintense signal abnormalities, unexpected for age and usually related to chronic small vessel ischemia.  IMPRESSION:  1.  No acute intracranial abnormality. 2.  Chronic small vessel ischemia changes, age advanced.  3. Right mastoid effusion or mucosal thickening.   Original Report Authenticated By: Tiburcio Pea   1. Acute Renal Failure 2. Altered Mental Status MDM  58 yo male presenting with bizarre behavior.  EMS report that police found him walking around the woods naked.  They therefore brought him to the ED for  evaluation.  He was alert and oriented in the ED.  Somewhat tangential.  He has superficial scratches on his leg that he says are because he was cutting down trees.  They do not appear to be self inflicted.  I discussed his case with his psychiatrist, Dr. Betti Cruz, who felt that he might benefit from stabilization at [facility].  However, his medical screening revealed acute renal failure, so he was admitted to the Hospitalist service for further workup.    Candyce Churn, MD 03/09/13 [phone]

## 2013-03-08 NOTE — ED Notes (Signed)
MD at bedside. 

## 2013-03-08 NOTE — ED Notes (Signed)
Pt unable to complete sentences, pt mumbling about being out of pain meds, opania. Pt found wondering nude, pt has abrasions all over arms and legs, states out in woods, pt not making sense. Pt denies alcohol today states took 1 xanax

## 2013-03-08 NOTE — Progress Notes (Signed)
Patient given proxy forms for My Chart. He states he does not use a computer but has family members who do. Briscoe Burns BSN, RN-BC Admissions RN  03/08/2013 3:37 PM

## 2013-03-08 NOTE — H&P (Addendum)
[facility] History and Physical  Dale [name] XLK:[id number] DOB: 1956/01/07 DOA: 03/08/2013  Referring physician: wofford.  PCP: Standley Dakins, MD  Specialists: none  Chief Complaint: AMS.  HPI: Dale [name] is a 57 y.o. male with PMH significant for chronic pain syndrome on chronic opioid, he was dismissed from a pain clinic in [place], he has not establish care with a pain clinic here. He ran out of his pain medications 2 weeks ago. He was brought to ED because he was found wandering in the woods nude. Patient relates that this is not true. 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 He knew he is at the hospital, he recognized his son. He saw his Psychistrist yesterday. His Seroquel was increase to 400 mg but he didn't want to take the increase dose. He took some xanax. He denies drinking alcohol. He denies fever, chills, has mild cough, no headaches. He is complaining of his usual back pain. He is asking for opana.  Patient was seen in the ED 2 days ago with slurred speech, confusion. He was evaluated by neurologist. He had MRI negative for acute stroke. Neurologist thought opioid withdrawal might play role in patient condition.   Review of Systems: Negative except as per HIP.   Past Medical History  Diagnosis Date  . Back pain    Past Surgical History  Procedure Laterality Date  . Leg surgery      left femur fx   Social History:  reports that he has been smoking Cigarettes.  He has a 30 pack-year smoking history. He has never used smokeless tobacco. He reports that  drinks alcohol. He reports that he does not use illicit drugs. drinks socially.    Allergies  Allergen Reactions  . Penicillins Hives    Family History: No medical problems his family.   Prior to Admission medications   Medication Sig Start Date End Date Taking? Authorizing Provider  ALPRAZolam Prudy Feeler) 0.5 MG tablet Take 1 mg by mouth 3 (three) times daily  as needed.    Yes Historical Provider, MD  cyclobenzaprine (FLEXERIL) 10 MG tablet Take 10 mg by mouth 2 (two) times daily as needed. For muscle spasms.   Yes Historical Provider, MD  meloxicam (MOBIC) 15 MG tablet Take 15 mg by mouth daily as needed. For pain   Yes Historical Provider, MD  oxymorphone (OPANA ER) 10 MG 12 hr tablet Take 10 mg by mouth every 4 (four) hours as needed (break through pain). For pain 12/17/12  Yes Linna Hoff, MD  oxymorphone (OPANA ER) 40 MG 12 hr tablet Take 40 mg by mouth every 12 (twelve) hours. 12/28/12  Yes Nishant Dhungel, MD  QUEtiapine (SEROQUEL) 200 MG tablet Take 200 mg by mouth at bedtime.   Yes Historical Provider, MD  tadalafil (CIALIS) 20 MG tablet Take 20 mg by mouth daily as needed for erectile dysfunction.    Historical Provider, MD   Physical Exam: Filed Vitals:   03/08/13 1032  BP: 131/89  Pulse: 114  Temp: 98.1 F (36.7 C)  TempSrc: Oral  Resp: 16  SpO2: 98%   General Appearance:    Alert, cooperative, no distress,   Head:    Normocephalic, without obvious abnormality, atraumatic  Eyes:    PERRL, conjunctiva/corneas clear, EOM's intact,     Ears:    Normal TM's and external ear canals, both ears  Nose:   Nares normal, septum midline, mucosa normal, no drainage  or sinus tenderness  Throat:   Lips, mucosa, and tongue normal;  Neck:   Supple, symmetrical, trachea midline, no adenopathy;       thyroid:  No enlargement/tenderness/nodules; no carotid   bruit or JVD  Back:     Symmetric, no curvature, ROM normal, no CVA tenderness  Lungs:     Clear to auscultation bilaterally, respirations unlabored  Chest wall:    No tenderness or deformity  Heart:    Regular rate and rhythm, S1 and S2 normal, no murmur, rub   or gallop  Abdomen:     Soft, non-tender, bowel sounds active all four quadrants,    no masses, no organomegaly        Extremities:   Extremities normal, atraumatic, no cyanosis or edema  Pulses:   2+ and symmetric all  extremities  Skin:   Multiple skin tear, excoriations.   Lymph nodes:   Cervical, supraclavicular, and axillary nodes normal  Neurologic:   CNII-XII intact. Normal strength, sensation and reflexes      throughout      Labs on Admission:  Basic Metabolic Panel:  Recent Labs Lab 03/06/13 0959 03/08/13 1250  NA 141 141  K 3.6 4.2  CL 102 101  CO2 25 22  GLUCOSE 104* 110*  BUN 29* 54*  CREATININE 1.03 3.13*  CALCIUM 9.6 10.1   Liver Function Tests:  Recent Labs Lab 03/06/13 0959 03/08/13 1250  AST 103* 116*  ALT 40 53  ALKPHOS 67 68  BILITOT 0.9 0.7  PROT 7.0 8.3  ALBUMIN 4.0 4.8   No results found for this basename: LIPASE, AMYLASE,  in the last 168 hours No results found for this basename: AMMONIA,  in the last 168 hours CBC:  Recent Labs Lab 03/06/13 0959 03/08/13 1250  WBC 6.2 13.8*  HGB 14.1 14.6  HCT 41.1 42.6  MCV 93.0 94.2  PLT 174 197   Cardiac Enzymes: No results found for this basename: CKTOTAL, CKMB, CKMBINDEX, TROPONINI,  in the last 168 hours  BNP (last 3 results) No results found for this basename: PROBNP,  in the last 8760 hours CBG:  Recent Labs Lab 03/06/13 0934  GLUCAP 101*    Radiological Exams on Admission: Mr Brain Wo Contrast  03/06/2013   *RADIOLOGY REPORT*  Clinical Data: Altered mental status.  Followup head CT low density.  MRI HEAD WITHOUT CONTRAST  Technique:  Multiplanar, multiecho pulse sequences of the brain and surrounding structures were obtained according to standard protocol without intravenous contrast.  Comparison: Head CT from earlier the same day.  Findings:  Calvarium and upper cervical spine: No marrow signal abnormality.  Orbits: No significant findings.  Sinuses: Clear paranasal sinuses. Right mastoid effusion or mucosal thickening.  No suspicious abnormality in the nasopharynx.  Brain: No acute abnormality such as acute infarct, hemorrhage, hydrocephalus, or mass lesion.  No evidence of large vessel  occlusion.Scattered cerebral white matter T2 hyperintense signal abnormalities, unexpected for age and usually related to chronic small vessel ischemia.  IMPRESSION:  1.  No acute intracranial abnormality. 2.  Chronic small vessel ischemia changes, age advanced.  3. Right mastoid effusion or mucosal thickening.   Original Report Authenticated By: Tiburcio Pea    EKG: Independently reviewed.   Assessment/Plan  1-AMS/Encephalopathy/Psychosis??? : no evidence of infection, no headache, fever. UDS pending. Alcohol level less than 10. Psych consulted. Component of opioid withdrawal. Will order PRN Tramadol.   2-Acute renal Failure: Likely pre-renal in setting decrease volume. Continue with IV fluids.  Strict I and O. Repeat renal function in am, if no improvement will order renal US. Patient was not taking meloxican. Check UA. Check Bladder scan.   3-Chronic back pain:  I will order tramadol for pain. He will need to establish care with pain clinic.   4-Mood Disorder: Continue with Seroquel 200 mg. Psych consulted.  5-Right mastoid effusion or mucosal thickening: Will start ciprofloxacin to cover for infection.     Code Status: Presume Full Code.  Family Communication: Care discussed with son who was at bedside.  Disposition Plan: expect 3 to 4 days inpatient.   Time spent: 75 minutes.   REGALADO,BELKYS [facility] Pager [phone]  If 7PM-7AM, please contact night-coverage [email or web address] Password [facility] 03/08/2013, 3:34 PM

## 2013-03-08 NOTE — ED Notes (Signed)
Per EMS pt found by sheriff dept walking around nude, sent here d/t [facility]

## 2013-03-08 NOTE — ED Notes (Signed)
Pt. Aware of giving urine specimen. Nurse was notified.  Pt. Unable to give specimen.

## 2013-03-09 DIAGNOSIS — E440 Moderate protein-calorie malnutrition: Secondary | ICD-10-CM

## 2013-03-09 DIAGNOSIS — D72829 Elevated white blood cell count, unspecified: Secondary | ICD-10-CM

## 2013-03-09 DIAGNOSIS — N179 Acute kidney failure, unspecified: Secondary | ICD-10-CM

## 2013-03-09 DIAGNOSIS — J329 Chronic sinusitis, unspecified: Secondary | ICD-10-CM

## 2013-03-09 LAB — TSH: TSH: 0.523 u[IU]/mL (ref 0.350–4.500)

## 2013-03-09 LAB — BASIC METABOLIC PANEL
BUN: 38 mg/dL — ABNORMAL HIGH (ref 6–23)
Chloride: 102 mEq/L (ref 96–112)
Creatinine, Ser: 1.31 mg/dL (ref 0.50–1.35)
GFR calc Af Amer: 68 mL/min — ABNORMAL LOW (ref >90)
GFR calc non Af Amer: 59 mL/min — ABNORMAL LOW (ref >90)
Glucose, Bld: 121 mg/dL — ABNORMAL HIGH (ref 70–99)
Potassium: 3.5 mEq/L (ref 3.5–5.1)

## 2013-03-09 LAB — CBC
HCT: 37.9 % — ABNORMAL LOW (ref 39.0–52.0)
Hemoglobin: 13.2 g/dL (ref 13.0–17.0)
MCH: 32.3 pg (ref 26.0–34.0)
MCHC: 34.8 g/dL (ref 30.0–36.0)
RDW: 13.3 % (ref 11.5–15.5)

## 2013-03-09 MED ORDER — TRAMADOL HCL 50 MG PO TABS: 50.0000 mg | ORAL_TABLET | Freq: Four times a day (QID) | ORAL | Status: DC | PRN

## 2013-03-09 MED ORDER — ENSURE COMPLETE PO LIQD
237.0000 mL | Freq: Three times a day (TID) | ORAL | Status: DC
  Administered 2013-03-09: 237 mL via ORAL

## 2013-03-09 MED ORDER — HYDROCODONE-ACETAMINOPHEN 5-325 MG PO TABS
1.0000 | ORAL_TABLET | Freq: Four times a day (QID) | ORAL | Status: DC | PRN
Start: 2013-03-09 — End: 2013-03-10
  Administered 2013-03-09: 1 via ORAL
  Filled 2013-03-09: qty 1

## 2013-03-09 MED ORDER — SODIUM CHLORIDE 0.9 % IV SOLN
INTRAVENOUS | Status: DC
  Administered 2013-03-09: 17:00:00 via INTRAVENOUS

## 2013-03-09 MED ORDER — CIPROFLOXACIN HCL 500 MG PO TABS
500.0000 mg | ORAL_TABLET | Freq: Two times a day (BID) | ORAL | Status: DC
  Administered 2013-03-09 – 2013-03-10 (×2): 500 mg via ORAL
  Filled 2013-03-09 (×4): qty 1

## 2013-03-09 MED ORDER — ADULT MULTIVITAMIN W/MINERALS CH
1.0000 | ORAL_TABLET | Freq: Every day | ORAL | Status: DC
  Administered 2013-03-09 – 2013-03-10 (×2): 1 via ORAL
  Filled 2013-03-09 (×2): qty 1

## 2013-03-09 NOTE — Progress Notes (Signed)
Clinical Social Work Department CLINICAL SOCIAL WORK PSYCHIATRY SERVICE LINE ASSESSMENT 03/09/2013  Patient:  Dale [name]  Account:  [id number]  Admit Date:  03/08/2013  Clinical Social Worker:  Unk Lightning, LCSW  Date/Time:  03/09/2013 10:45 AM Referred by:  Physician  Date referred:  03/09/2013 Reason for Referral  Psychosocial assessment   Presenting Symptoms/Problems (In the person's/family's own words):   Psych consulted due to patient wandering in the woods naked. Attending MD also wants capacity determined.   Abuse/Neglect/Trauma History (check all that apply)  Denies history   Abuse/Neglect/Trauma Comments:   Psychiatric History (check all that apply)  Outpatient treatment   Psychiatric medications:  Seroquel 200 mg  Xanax 1 mg   Current Mental Health Hospitalizations/Previous Mental Health History:   Patient reports he self diagnosed with himself with anxiety problems. Patient reports that pain clinic would not prescribe Xanax so he started meeting with a psychiatrist. Patient denies any other treatment.   Current provider:   Dr. Sandy Salaam and Date:   [facility]   Current Medications:   ALPRAZolam, cyclobenzaprine, HYDROcodone-acetaminophen, ondansetron (ZOFRAN) IV, ondansetron            . ciprofloxacin  400 mg Intravenous Q12H  . docusate sodium  100 mg Oral BID  . folic acid  1 mg Oral Daily  . heparin  5,000 Units Subcutaneous Q8H  . QUEtiapine  200 mg Oral QHS  . sodium chloride  3 mL Intravenous Q12H  . thiamine  100 mg Oral Daily   Previous Impatient Admission/Date/Reason:   Patient denies any previous hospitalizations.   Emotional Health / Current Symptoms    Suicide/Self Harm  None reported   Suicide attempt in the past:   Patient denies any SI or HI.   Other harmful behavior:   None reported   Psychotic/Dissociative Symptoms  Visual Hallucinations   Other Psychotic/Dissociative Symptoms:   Patient reports he  was experiencing visual hallucinations prior to admission. Patient states he would see people who were not actually there. Per patient's son, neighbors have reported that patient was talking to bike, reporting that his ex-girlfriend  was exercising in his yard, and saying that people were in his truck. Patient was admitted to [facility] on Sunday after stopping at a gym and telling the owners that people were in his truck. Patient then got back into his truck and drove away. [employer] pulled patient over and brought him to [facility] ED. Patient was later released.    Attention/Behavioral Symptoms  Inattentive   Other Attention / Behavioral Symptoms:   Patient minimally engaged with limited eye contact.    Cognitive Impairment  Orientation - Place  Orientation - Self  Orientation - Situation  Orientation - Time   Other Cognitive Impairment:   Patient alert and oriented and able to answer questions regarding his surroundings correctly.    Mood and Adjustment  Flat    Stress, Anxiety, Trauma, Any Recent Loss/Stressor  Anxiety   Anxiety (frequency):   Patient reports he has dealt with anxiety for several years. Patient feels that medication helps manage his mood.   Phobia (specify):   N/A   Compulsive behavior (specify):   N/A   Obsessive behavior (specify):   N/A   Other:   N/A   Substance Abuse/Use  None   SBIRT completed (please refer for detailed history):  N  Self-reported substance use:   Patient denies all substance use. Patient reports he takes his medications as prescribed and does  not use any substances. Patient's son reports that patient will drink 3-4 beers a week.   Urinary Drug Screen Completed:  Y Alcohol level:   <11    Environmental/Housing/Living Arrangement  Stable housing   Who is in the home:   Alone   Emergency contact:  Dale [name]   Financial  Medicare  Medicaid   Patient's Strengths and Goals (patient's own words):   Patient has supportive  family. Patient attends psychiatric appointments as scheduled.   Clinical Social Worker's Interpretive Summary:   CSW received referral to complete psychosocial assessment. CSW reviewed chart and met with patient at bedside. CSW introduced myself and explained role.    Patient reports that he lives alone and works for himself. Patient is unsure why he was brought to the hospital. Patient believes that a neighbor called 911. Patient reports that [employer] stated he was naked but patient reports that he was changing in his car when the police officer arrived.    Patient reports that he has lived in [place] for several years. Patient is not married and has two children. Patient's siblings are involved in care and patient reports good relationship with son. Patient reports that he was getting pain medication from pain clinic in [place] but was recently released from their care. Patient reports that he has been about 2-3 weeks without any of his pain medication.    Patient is able to answer questions regarding where he is and basic questions regarding his mental status. Patient is distracted throughout assessment and somewhat guarded. Patient denies any hallucinations initially but later reports that he has visual hallucinations which has cleared over the past day or two. Patient is agreeable for CSW to contact son.    CSW spoke with son via phone. Son reports that patient has been hallucinating over the past few weeks and is concerned about patient's safety. Son reports that when patient was brought to [facility] ED he felt he should not have been released. Patient had follow up appointment with Dr. Betti Cruz who felt that patient was possibly withdrawing from pain medications or had low levels of Seroquel. Dr. Betti Cruz increased Seroquel from 200 mg to 400 mg. Patient's sister attended appointment with patient and assisted him in getting prescription filled. Patient then denied to take his other medications and reported he did  not feel Seroquel was effective. Son has concerns that patient does not take Seroquel on a regular basis since he does not believe it is helpful.    After reporting patient's needs to Dr. Betti Cruz, it was suggested that patient go to [facility] for stabilization. CSW spoke with [facility] who confirmed they spoke with Dr. Betti Cruz and asked CSW to fax referral once patient is medically stable.    Son reports that he is concerned about patient's mood and is worried that he is withdrawing from pain medication. Patient had been attending a pain clinic in [place] for several years and was recently terminated. Patient was called in for a pill count and was short 4 pills. Son reports that patient misplaced the pills and went home and was able to find the 4 missing pills but was unable to return to clinic. Patient has been without his pain medication for 2-3 weeks now.    Son feels that patient needs assistance and reports he could stay with family but knows that patient will refuse to stay with family. At this time, patient is agreeable to treatment at [facility].    CSW will continue to  follow and will assist with DC planning.   Disposition:  Recommend Psych CSW continuing to support while in hospital

## 2013-03-09 NOTE — Progress Notes (Signed)
Patient's son phoned for an update on his father's condition.  States he wants to be present for when the psychiatrist sees his dad, I informed him not sure when the doctor would be making rounds but he was welcome to come anytime.

## 2013-03-09 NOTE — Progress Notes (Addendum)
INITIAL NUTRITION ASSESSMENT  Pt meets criteria for moderate MALNUTRITION in the context of acute illness as evidenced by 7.3% weight loss in the past month with likely <75% estimated energy intake for the past few months per pt report.  DOCUMENTATION CODES Per approved criteria  -Non-severe (moderate) malnutrition in the context of acute illness or injury   INTERVENTION: - Encouraged pt to try a variety of foods on the menu - Ensure Complete TID - Multivitamin 1 tablet PO daily - Will continue to monitor   NUTRITION DIAGNOSIS: Inadequate oral intake related to pt only eating fruit as evidenced by pt report.   Goal: Pt to consume >90% of meals/supplements  Monitor:  Weights, labs, intake  Reason for Assessment: Nutrition risk   57 y.o. male  Admitting Dx: Altered mental status  ASSESSMENT: Pt with visual hallucinations PTA, found wandering the woods naked. Met with pt who reports eating on/off meals during the day PTA. Thinks he's lost 10 pounds in the past year, however weight trend reveals pt's weight down 14 pounds unintentionally in the past month. Pt reports not liking the food on the menu, only wants to eat fruit. Per social worker's note, pt's son reports pt drinks 3-4 beers/week.   Height: Ht Readings from Last 1 Encounters:  03/08/13 6\' 3"  (1.905 m)    Weight: Wt Readings from Last 1 Encounters:  03/09/13 178 lb 9.2 oz (81 kg)    Ideal Body Weight: 196 lb   % Ideal Body Weight: 91%  Wt Readings from Last 10 Encounters:  03/09/13 178 lb 9.2 oz (81 kg)  01/31/13 192 lb (87.091 kg)  05/02/12 285 lb (129.275 kg)    Usual Body Weight: 192 lb last month  % Usual Body Weight: 93%  BMI:  Body mass index is 22.32 kg/(m^2).  Estimated Nutritional Needs: Kcal: 2400-2600 Protein: 120-149g Fluid: 2.4-2.6L/day  Skin: Intact   Diet Order: General  EDUCATION NEEDS: -No education needs identified at this time   Intake/Output Summary (Last 24 hours) at  03/09/13 1613 Last data filed at 03/09/13 0655  Gross per 24 hour  Intake   1600 ml  Output   1450 ml  Net    150 ml    Last BM: PTA  Labs:   Recent Labs Lab 03/06/13 0959 03/08/13 1250 03/09/13 0550  NA 141 141 138  K 3.6 4.2 3.5  CL 102 101 102  CO2 25 22 27   BUN 29* 54* 38*  CREATININE 1.03 3.13* 1.31  CALCIUM 9.6 10.1 8.9  GLUCOSE 104* 110* 121*    CBG (last 3)  No results found for this basename: GLUCAP,  in the last 72 hours  Scheduled Meds: . ciprofloxacin  400 mg Intravenous Q12H  . docusate sodium  100 mg Oral BID  . folic acid  1 mg Oral Daily  . heparin  5,000 Units Subcutaneous Q8H  . QUEtiapine  200 mg Oral QHS  . sodium chloride  3 mL Intravenous Q12H  . thiamine  100 mg Oral Daily    Continuous Infusions: . dextrose 5 % and 0.45% NaCl 125 mL/hr at 03/08/13 1712    Past Medical History  Diagnosis Date  . Back pain   . Acute renal failure 03/08/2013    Past Surgical History  Procedure Laterality Date  . Leg surgery      left femur fx    Levon Hedger MS, RD, LDN [phone] Pager [phone] After Hours Pager

## 2013-03-09 NOTE — Progress Notes (Signed)
[facility] PROGRESS NOTE  Dale [name] WUJ:[id number] DOB: [birth year] DOA: 03/08/2013 PCP: Standley Dakins, MD  Assessment/Plan: 1-AMS/Encephalopathy/Psychosis??? : no evidence of severe infection, no headache, no fever. UDS pending. Alcohol level less than 10. Psych consulted. Component of opioid/benzodiasepines  withdrawal. Will order PRN Tramadol.  -continue supportive care  2-Acute renal Failure: Likely pre-renal in setting decrease volume. -renal function improved and Cr is 1.3 -Continue with IV fluids but will adjust rate.  -continue Strict I and O.  -avoid NSAID's   3-Chronic back pain: I will continue tramadol PRN for pain. He will need to establish care with pain clinic. -continue also flexeril as needed for muscle spasm.  4-Mood Disorder: Continue with Seroquel 200 mg and PRN ativan.  -Psych consulted will follow recommendations.   5-Right mastoid effusion or mucosal thickening and leukocytosis: Will -continue tx with ciprofloxacin to cover for sinusitis -afebrile  6-Moderate protein calorie malnutrition: -encourage to eat better -started on ensure BID -daily MV   Code Status: Full Family Communication: no family at bedside  Disposition Plan: will need inpatient psych therapy to stabilize mood and behavior. [facility] (most likely tomorrow if bed available)    Consultants:  psych  Procedures:  See below for x-ray reports  Antibiotics:  ciprofloxacin  HPI/Subjective: Afebrile, complaining of back pain (intermittently) slightly anxious but coherent.  Objective: Filed Vitals:   03/08/13 2015 03/08/13 2200 03/09/13 0600 03/09/13 1446  BP:  127/85 122/80 138/88  Pulse:  85 84 86  Temp:  98.1 F (36.7 C) 97.9 F (36.6 C) 98.2 F (36.8 C)  TempSrc:  Oral Oral Oral  Resp:  18 18 18   Height: 6\' 3"  (1.905 m)     Weight: 87.09 kg (192 lb)  81 kg (178 lb 9.2 oz)   SpO2:  99% 98% 98%    Intake/Output Summary (Last 24 hours) at 03/09/13 1609 Last  data filed at 03/09/13 0655  Gross per 24 hour  Intake   1600 ml  Output   1450 ml  Net    150 ml   Filed Weights   03/08/13 2015 03/09/13 0600  Weight: 87.09 kg (192 lb) 81 kg (178 lb 9.2 oz)    Exam:   General:  NAD, afebrile; AAOX3  Cardiovascular: S1 and S2, no rubs, no gallops  Respiratory: CTA bilaterally; some runny nose and muffle speech present  Abdomen: soft. NT, ND, positive BS  Musculoskeletal: complaining of back pain (chronic); no joint sweelling, no erythema  Neuro:no focal deficit  Data Reviewed: Basic Metabolic Panel:  Recent Labs Lab 03/06/13 0959 03/08/13 1250 03/09/13 0550  NA 141 141 138  K 3.6 4.2 3.5  CL 102 101 102  CO2 25 22 27   GLUCOSE 104* 110* 121*  BUN 29* 54* 38*  CREATININE 1.03 3.13* 1.31  CALCIUM 9.6 10.1 8.9   Liver Function Tests:  Recent Labs Lab 03/06/13 0959 03/08/13 1250  AST 103* 116*  ALT 40 53  ALKPHOS 67 68  BILITOT 0.9 0.7  PROT 7.0 8.3  ALBUMIN 4.0 4.8   CBC:  Recent Labs Lab 03/06/13 0959 03/08/13 1250 03/09/13 0550  WBC 6.2 13.8* 8.8  HGB 14.1 14.6 13.2  HCT 41.1 42.6 37.9*  MCV 93.0 94.2 92.7  PLT 174 197 160     CBG:  Recent Labs Lab 03/06/13 0934  GLUCAP 101*    Studies: No results found.  Scheduled Meds: . ciprofloxacin  400 mg Intravenous Q12H  . docusate sodium  100 mg Oral BID  .  folic acid  1 mg Oral Daily  . heparin  5,000 Units Subcutaneous Q8H  . QUEtiapine  200 mg Oral QHS  . sodium chloride  3 mL Intravenous Q12H  . thiamine  100 mg Oral Daily   Continuous Infusions: . dextrose 5 % and 0.45% NaCl 125 mL/hr at 03/08/13 1712    Active Problems:   Altered mental status   Leukocytosis   Acute renal failure    Time spent: >30 minutes   Kendi Defalco  [facility] Pager [phone]. If 7PM-7AM, please contact night-coverage at [email or web address], password [facility] 03/09/2013, 4:09 PM  LOS: 1 day

## 2013-03-09 NOTE — Progress Notes (Signed)
CSW met with patient and gave information about advanced directives. Patient states that he does not have an advanced directive but would be interested to review information. He thanked CSW for the time and will notify nurse if he decides to complete it.  Kelechi Astarita C. Markhi Kleckner MSW, LCSW [phone]

## 2013-03-09 NOTE — Consult Note (Signed)
Reason for Consult: Competency, psychosis Referring Physician: Dr. Frederica Kuster Buckalew is an 57 y.o. male.  HPI: Patient was found wandering in the woods without clothes prior to admission with psychosis.  On assessment, he is sitting upright in the bed and engages easily but guarded with some information.  He admits to having a regular psychiatrist but denies having any psychiatric diagnoses--despite being on Seroquel and Xanax daily.  Mr. Dale [name] does state he drinks 1-2 beers on 3/7 days of the week and mixed drinks on the week-end.  Denies alcohol being an issue now or in the past.  Admits to marijuana and cocaine abuse in the past but never went to rehab.  Drug screen negative for drugs except for benzodiazepines.  He knows he is in the hospital in [place] and the date.  Vague on why he came to the ED, stated his family member brought him.  He denies suicidal/homicidal ideations and auditory/visual hallucinations, does not appear to be responding to internal assessment.  4/10 for anxiety which he contributes to being in the hospital.  Dr. Lucianne Muss has reviewed this case and collaborated with the treatment plan, she and this practitioner will re-evaluate the patient tomorrow.  Past Medical History  Diagnosis Date  . Back pain   . Acute renal failure 03/08/2013    Past Surgical History  Procedure Laterality Date  . Leg surgery      left femur fx    History reviewed. No pertinent family history.  Social History:  reports that he has been smoking Cigarettes.  He has a 30 pack-year smoking history. He has never used smokeless tobacco. He reports that  drinks alcohol. He reports that he does not use illicit drugs.  Allergies:  Allergies  Allergen Reactions  . Penicillins Hives    Medications: I have reviewed the patient's current medications.  Results for orders placed during the hospital encounter of 03/08/13 (from the past 48 hour(s))  ACETAMINOPHEN LEVEL     Status: None    Collection Time    03/08/13 12:50 PM      Result Value Range   Acetaminophen (Tylenol), Serum <15.0  10 - 30 ug/mL   Comment:            THERAPEUTIC CONCENTRATIONS VARY     SIGNIFICANTLY. A RANGE OF 10-30     ug/mL MAY BE AN EFFECTIVE     CONCENTRATION FOR MANY PATIENTS.     HOWEVER, SOME ARE BEST TREATED     AT CONCENTRATIONS OUTSIDE THIS     RANGE.     ACETAMINOPHEN CONCENTRATIONS     >150 ug/mL AT 4 HOURS AFTER     INGESTION AND >50 ug/mL AT 12     HOURS AFTER INGESTION ARE     OFTEN ASSOCIATED WITH TOXIC     REACTIONS.  CBC     Status: Abnormal   Collection Time    03/08/13 12:50 PM      Result Value Range   WBC 13.8 (*) 4.0 - 10.5 K/uL   RBC 4.52  4.22 - 5.81 MIL/uL   Hemoglobin 14.6  13.0 - 17.0 g/dL   HCT 65.7  84.6 - 96.2 %   MCV 94.2  78.0 - 100.0 fL   MCH 32.3  26.0 - 34.0 pg   MCHC 34.3  30.0 - 36.0 g/dL   RDW 95.2  84.1 - 32.4 %   Platelets 197  150 - 400 K/uL  COMPREHENSIVE METABOLIC PANEL     Status:  Abnormal   Collection Time    03/08/13 12:50 PM      Result Value Range   Sodium 141  135 - 145 mEq/L   Potassium 4.2  3.5 - 5.1 mEq/L   Chloride 101  96 - 112 mEq/L   CO2 22  19 - 32 mEq/L   Glucose, Bld 110 (*) 70 - 99 mg/dL   BUN 54 (*) 6 - 23 mg/dL   Creatinine, Ser 1.47 (*) 0.50 - 1.35 mg/dL   Calcium 82.9  8.4 - 56.2 mg/dL   Total Protein 8.3  6.0 - 8.3 g/dL   Albumin 4.8  3.5 - 5.2 g/dL   AST 130 (*) 0 - 37 U/L   ALT 53  0 - 53 U/L   Alkaline Phosphatase 68  39 - 117 U/L   Total Bilirubin 0.7  0.3 - 1.2 mg/dL   GFR calc non Af Amer 21 (*) >90 mL/min   GFR calc Af Amer 24 (*) >90 mL/min   Comment:            The eGFR has been calculated     using the CKD EPI equation.     This calculation has not been     validated in all clinical     situations.     eGFR's persistently     <90 mL/min signify     possible Chronic Kidney Disease.  ETHANOL     Status: None   Collection Time    03/08/13 12:50 PM      Result Value Range   Alcohol, Ethyl (B)  <11  0 - 11 mg/dL   Comment:            LOWEST DETECTABLE LIMIT FOR     SERUM ALCOHOL IS 11 mg/dL     FOR MEDICAL PURPOSES ONLY  SALICYLATE LEVEL     Status: Abnormal   Collection Time    03/08/13 12:50 PM      Result Value Range   Salicylate Lvl <2.0 (*) 2.8 - 20.0 mg/dL  URINE RAPID DRUG SCREEN (HOSP PERFORMED)     Status: Abnormal   Collection Time    03/08/13  6:40 PM      Result Value Range   Opiates NONE DETECTED  NONE DETECTED   Cocaine NONE DETECTED  NONE DETECTED   Benzodiazepines POSITIVE (*) NONE DETECTED   Amphetamines NONE DETECTED  NONE DETECTED   Tetrahydrocannabinol NONE DETECTED  NONE DETECTED   Barbiturates NONE DETECTED  NONE DETECTED   Comment:            DRUG SCREEN FOR MEDICAL PURPOSES     ONLY.  IF CONFIRMATION IS NEEDED     FOR ANY PURPOSE, NOTIFY LAB     WITHIN 5 DAYS.                LOWEST DETECTABLE LIMITS     FOR URINE DRUG SCREEN     Drug Class       Cutoff (ng/mL)     Amphetamine      1000     Barbiturate      200     Benzodiazepine   200     Tricyclics       300     Opiates          300     Cocaine          300     THC  50  URINALYSIS, ROUTINE W REFLEX MICROSCOPIC     Status: Abnormal   Collection Time    03/08/13  6:40 PM      Result Value Range   Color, Urine AMBER (*) YELLOW   Comment: BIOCHEMICALS MAY BE AFFECTED BY COLOR   APPearance CLOUDY (*) CLEAR   Specific Gravity, Urine 1.029  1.005 - 1.030   pH 5.0  5.0 - 8.0   Glucose, UA NEGATIVE  NEGATIVE mg/dL   Hgb urine dipstick LARGE (*) NEGATIVE   Bilirubin Urine SMALL (*) NEGATIVE   Ketones, ur NEGATIVE  NEGATIVE mg/dL   Protein, ur 30 (*) NEGATIVE mg/dL   Urobilinogen, UA 0.2  0.0 - 1.0 mg/dL   Nitrite NEGATIVE  NEGATIVE   Leukocytes, UA NEGATIVE  NEGATIVE  URINE MICROSCOPIC-ADD ON     Status: Abnormal   Collection Time    03/08/13  6:40 PM      Result Value Range   Squamous Epithelial / LPF FEW (*) RARE   WBC, UA 0-2  <3 WBC/hpf   RBC / HPF 11-20  <3 RBC/hpf    Bacteria, UA RARE  RARE   Casts HYALINE CASTS (*) NEGATIVE  TSH     Status: None   Collection Time    03/09/13  5:50 AM      Result Value Range   TSH 0.523  0.350 - 4.500 uIU/mL  VITAMIN B12     Status: None   Collection Time    03/09/13  5:50 AM      Result Value Range   Vitamin B-12 483  211 - 911 pg/mL  BASIC METABOLIC PANEL     Status: Abnormal   Collection Time    03/09/13  5:50 AM      Result Value Range   Sodium 138  135 - 145 mEq/L   Potassium 3.5  3.5 - 5.1 mEq/L   Chloride 102  96 - 112 mEq/L   CO2 27  19 - 32 mEq/L   Glucose, Bld 121 (*) 70 - 99 mg/dL   BUN 38 (*) 6 - 23 mg/dL   Creatinine, Ser 4.54  0.50 - 1.35 mg/dL   Comment: DELTA CHECK NOTED     REPEATED TO VERIFY   Calcium 8.9  8.4 - 10.5 mg/dL   GFR calc non Af Amer 59 (*) >90 mL/min   GFR calc Af Amer 68 (*) >90 mL/min   Comment:            The eGFR has been calculated     using the CKD EPI equation.     This calculation has not been     validated in all clinical     situations.     eGFR's persistently     <90 mL/min signify     possible Chronic Kidney Disease.  CBC     Status: Abnormal   Collection Time    03/09/13  5:50 AM      Result Value Range   WBC 8.8  4.0 - 10.5 K/uL   RBC 4.09 (*) 4.22 - 5.81 MIL/uL   Hemoglobin 13.2  13.0 - 17.0 g/dL   HCT 09.8 (*) 11.9 - 14.7 %   MCV 92.7  78.0 - 100.0 fL   MCH 32.3  26.0 - 34.0 pg   MCHC 34.8  30.0 - 36.0 g/dL   RDW 82.9  56.2 - 13.0 %   Platelets 160  150 - 400 K/uL    No results found.  Review of Systems  Constitutional: Negative.   HENT: Negative.   Eyes: Negative.   Respiratory: Negative.   Cardiovascular: Negative.   Gastrointestinal: Negative.   Genitourinary: Negative.   Musculoskeletal: Positive for back pain.  Skin: Negative.   Neurological: Negative.   Endo/Heme/Allergies: Negative.    Blood pressure 138/88, pulse 86, temperature 98.2 F (36.8 C), temperature source Oral, resp. rate 18, height 6\' 3"  (1.905 m), weight 81 kg  (178 lb 9.2 oz), SpO2 98.00%. Physical Exam  Completed by primary MD, reviewed  Family History:  No family history on file.  Assessment/Plan:   Mental Status Examination/Evaluation: Patient is disheveled, very thin, with good eye contact. Euthymic mood with blunted affect.  He is calm and cooperative, denies any active or passive suicidal thoughts or homicidal thoughts.  His thoughts are organized but cannot remember any of his mental health diagnoses.  I believe he does know but does not want to reveal for unknown reason.  There is no paranoia or delusions present at this time. He denies any auditory or visual hallucination. His attention and concentration are fair to poor.  His insight is poor and judgment is fair.  DIAGNOSIS:  AXIS I  General Anxiety Disorder  AXIS II  Deferred   AXIS III  Acute renal failure, chronic back pain  AXIS IV  other psychosocial or environmental problems, financial and housing issues, problems with access to health care services and problems with primary support group   AXIS V  61-70 mild symptoms    Assessment/Plan:  Recommend ammonia level due to abnormal urobilinogen levels and recent confusion.  Recommend continue current psychiatric medications. Patient does not need inpatient psychiatric treatment at this time, will re-evaluate tomorrow. Recommend followup treatment with his regular provider. Contact Child psychotherapist for outpatient discharge planning.  Nanine Means, PMH-NP 03/09/2013, 9:20 PM

## 2013-03-10 DIAGNOSIS — E440 Moderate protein-calorie malnutrition: Secondary | ICD-10-CM | POA: Insufficient documentation

## 2013-03-10 LAB — BASIC METABOLIC PANEL
BUN: 20 mg/dL (ref 6–23)
CO2: 26 mEq/L (ref 19–32)
Chloride: 103 mEq/L (ref 96–112)
Creatinine, Ser: 0.78 mg/dL (ref 0.50–1.35)
GFR calc Af Amer: >90 mL/min (ref >90)
Glucose, Bld: 121 mg/dL — ABNORMAL HIGH (ref 70–99)

## 2013-03-10 MED ORDER — CIPROFLOXACIN HCL 500 MG PO TABS: 500.0000 mg | ORAL_TABLET | Freq: Two times a day (BID) | ORAL | Status: AC

## 2013-03-10 MED ORDER — ALPRAZOLAM 0.5 MG PO TABS: 1.0000 mg | ORAL_TABLET | Freq: Three times a day (TID) | ORAL | Status: DC | PRN

## 2013-03-10 MED ORDER — ENSURE COMPLETE PO LIQD: 237.0000 mL | Freq: Three times a day (TID) | ORAL | Status: DC

## 2013-03-10 MED ORDER — ADULT MULTIVITAMIN W/MINERALS CH: 1.0000 | ORAL_TABLET | Freq: Every day | ORAL | Status: DC

## 2013-03-10 MED ORDER — POTASSIUM CHLORIDE CRYS ER 20 MEQ PO TBCR
40.0000 meq | EXTENDED_RELEASE_TABLET | ORAL | Status: AC
  Administered 2013-03-10: 40 meq via ORAL
  Filled 2013-03-10 (×2): qty 2

## 2013-03-10 MED ORDER — TRAMADOL HCL 50 MG PO TABS: 50.0000 mg | ORAL_TABLET | Freq: Four times a day (QID) | ORAL | Status: DC | PRN

## 2013-03-10 NOTE — Progress Notes (Signed)
Attempted to call report again to [facility]; no answer

## 2013-03-10 NOTE — Progress Notes (Signed)
Attempted to call report to [facility]; no answer
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
## 2013-03-10 NOTE — Progress Notes (Signed)
Clinical Social Work  [facility] accepted patient by Dr. Betti Cruz. RN to call report to [phone]. CSW informed patient and son who are still agreeable to transfer to [facility]. CSW informed RN and provided report number. CSW prepared PTAR paperwork and scheduled for a 2:30pm pick up. Request # [id number]. CSW is signing off but available if needed.  [place], [place] [phone]

## 2013-03-10 NOTE — Progress Notes (Signed)
Clinical Social Work  CSW spoke with Christiane Ha at [facility] who reports patient has been accepted to facility pending a new BUN and BUN being within normal limits or trending in the right direction. CSW spoke with MD and requested lab work.  CSW will continue to follow.  [place], [place] [phone]

## 2013-03-13 NOTE — Consult Note (Signed)
Recommendations discussed

## 2013-05-27 ENCOUNTER — Emergency Department (HOSPITAL_COMMUNITY): Payer: PRIVATE HEALTH INSURANCE

## 2013-05-27 ENCOUNTER — Encounter (HOSPITAL_COMMUNITY): Payer: Self-pay | Admitting: Emergency Medicine

## 2013-05-27 ENCOUNTER — Other Ambulatory Visit (HOSPITAL_COMMUNITY): Payer: Self-pay

## 2013-05-27 ENCOUNTER — Emergency Department (HOSPITAL_COMMUNITY)
Admission: EM | Admit: 2013-05-27 | Discharge: 2013-05-28 | Disposition: A | Discharge status: Home / Self Care | Payer: PRIVATE HEALTH INSURANCE | Attending: Emergency Medicine | Admitting: Emergency Medicine

## 2013-05-27 DIAGNOSIS — Z87448 Personal history of other diseases of urinary system: Secondary | ICD-10-CM | POA: Insufficient documentation

## 2013-05-27 DIAGNOSIS — Z79899 Other long term (current) drug therapy: Secondary | ICD-10-CM | POA: Insufficient documentation

## 2013-05-27 DIAGNOSIS — S0990XA Unspecified injury of head, initial encounter: Secondary | ICD-10-CM

## 2013-05-27 DIAGNOSIS — R1011 Right upper quadrant pain: Secondary | ICD-10-CM | POA: Insufficient documentation

## 2013-05-27 DIAGNOSIS — Z880 Allergy status to penicillin: Secondary | ICD-10-CM | POA: Insufficient documentation

## 2013-05-27 DIAGNOSIS — S0100XA Unspecified open wound of scalp, initial encounter: Secondary | ICD-10-CM | POA: Insufficient documentation

## 2013-05-27 DIAGNOSIS — IMO0002 Reserved for concepts with insufficient information to code with codable children: Secondary | ICD-10-CM | POA: Insufficient documentation

## 2013-05-27 DIAGNOSIS — S2239XA Fracture of one rib, unspecified side, initial encounter for closed fracture: Secondary | ICD-10-CM

## 2013-05-27 DIAGNOSIS — S2249XA Multiple fractures of ribs, unspecified side, initial encounter for closed fracture: Secondary | ICD-10-CM | POA: Insufficient documentation

## 2013-05-27 DIAGNOSIS — F17200 Nicotine dependence, unspecified, uncomplicated: Secondary | ICD-10-CM | POA: Insufficient documentation

## 2013-05-27 DIAGNOSIS — M549 Dorsalgia, unspecified: Secondary | ICD-10-CM | POA: Insufficient documentation

## 2013-05-27 DIAGNOSIS — S0101XA Laceration without foreign body of scalp, initial encounter: Secondary | ICD-10-CM

## 2013-05-27 DIAGNOSIS — Y9389 Activity, other specified: Secondary | ICD-10-CM | POA: Insufficient documentation

## 2013-05-27 DIAGNOSIS — Y929 Unspecified place or not applicable: Secondary | ICD-10-CM | POA: Insufficient documentation

## 2013-05-27 DIAGNOSIS — R9389 Abnormal findings on diagnostic imaging of other specified body structures: Secondary | ICD-10-CM | POA: Insufficient documentation

## 2013-05-27 DIAGNOSIS — S3981XA Other specified injuries of abdomen, initial encounter: Secondary | ICD-10-CM | POA: Insufficient documentation

## 2013-05-27 LAB — COMPREHENSIVE METABOLIC PANEL
ALT: 29 U/L (ref 0–53)
Alkaline Phosphatase: 72 U/L (ref 39–117)
BUN: 17 mg/dL (ref 6–23)
CO2: 25 mEq/L (ref 19–32)
Chloride: 103 mEq/L (ref 96–112)
GFR calc Af Amer: >90 mL/min (ref >90)
Glucose, Bld: 117 mg/dL — ABNORMAL HIGH (ref 70–99)
Potassium: 4.2 mEq/L (ref 3.5–5.1)
Sodium: 140 mEq/L (ref 135–145)
Total Bilirubin: 0.4 mg/dL (ref 0.3–1.2)

## 2013-05-27 LAB — POCT I-STAT, CHEM 8
Calcium, Ion: 1.19 mmol/L (ref 1.12–1.23)
Hemoglobin: 13.6 g/dL (ref 13.0–17.0)
Sodium: 140 mEq/L (ref 135–145)
TCO2: 26 mmol/L (ref 0–100)

## 2013-05-27 LAB — CG4 I-STAT (LACTIC ACID): Lactic Acid, Venous: 1.17 mmol/L (ref 0.5–2.2)

## 2013-05-27 LAB — CBC
HCT: 37.1 % — ABNORMAL LOW (ref 39.0–52.0)
Hemoglobin: 13.1 g/dL (ref 13.0–17.0)
RBC: 4.01 MIL/uL — ABNORMAL LOW (ref 4.22–5.81)
WBC: 9.9 10*3/uL (ref 4.0–10.5)

## 2013-05-27 MED ORDER — FENTANYL CITRATE 0.05 MG/ML IJ SOLN
50.0000 ug | Freq: Once | INTRAMUSCULAR | Status: AC
  Administered 2013-05-27: 50 ug via INTRAVENOUS
  Filled 2013-05-27: qty 2

## 2013-05-27 MED ORDER — IOHEXOL 300 MG/ML  SOLN
100.0000 mL | Freq: Once | INTRAMUSCULAR | Status: AC | PRN
  Administered 2013-05-27: 100 mL via INTRAVENOUS

## 2013-05-27 MED ORDER — SODIUM CHLORIDE 0.9 % IV BOLUS (SEPSIS)
1000.0000 mL | Freq: Once | INTRAVENOUS | Status: AC
  Administered 2013-05-27: 1000 mL via INTRAVENOUS

## 2013-05-27 MED ORDER — FENTANYL CITRATE 0.05 MG/ML IJ SOLN
100.0000 ug | Freq: Once | INTRAMUSCULAR | Status: AC
  Administered 2013-05-27: 100 ug via INTRAVENOUS
  Filled 2013-05-27: qty 2

## 2013-05-27 MED ORDER — HYDROMORPHONE HCL PF 1 MG/ML IJ SOLN
0.5000 mg | Freq: Once | INTRAMUSCULAR | Status: AC
  Administered 2013-05-28: 0.5 mg via INTRAVENOUS
  Filled 2013-05-27: qty 1
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
## 2013-05-28 ENCOUNTER — Telehealth (HOSPITAL_COMMUNITY): Payer: Self-pay | Admitting: Emergency Medicine

## 2013-05-28 DIAGNOSIS — S2239XA Fracture of one rib, unspecified side, initial encounter for closed fracture: Secondary | ICD-10-CM

## 2013-05-28 LAB — SAMPLE TO BLOOD BANK

## 2013-05-28 MED ORDER — HYDROMORPHONE HCL PF 1 MG/ML IJ SOLN
1.0000 mg | Freq: Once | INTRAMUSCULAR | Status: AC
  Administered 2013-05-28: 1 mg via INTRAVENOUS
  Filled 2013-05-28: qty 1

## 2013-05-28 MED ORDER — OXYCODONE-ACETAMINOPHEN 5-325 MG PO TABS
2.0000 | ORAL_TABLET | Freq: Once | ORAL | Status: AC
  Administered 2013-05-28: 2 via ORAL
  Filled 2013-05-28: qty 2

## 2013-05-28 MED ORDER — ALPRAZOLAM 0.25 MG PO TABS
1.0000 mg | ORAL_TABLET | Freq: Once | ORAL | Status: AC
  Administered 2013-05-28: 1 mg via ORAL
  Filled 2013-05-28: qty 4

## 2013-05-28 MED ORDER — MORPHINE SULFATE 4 MG/ML IJ SOLN
4.0000 mg | INTRAMUSCULAR | Status: DC | PRN
  Administered 2013-05-28 (×2): 4 mg via INTRAVENOUS
  Filled 2013-05-28 (×2): qty 1

## 2013-05-28 NOTE — ED Notes (Signed)
Ordered patient a breakfast tray per Dr. Jodi Mourning.

## 2013-05-28 NOTE — ED Notes (Signed)
Dr. Wyatt at bedside with patient.  
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
## 2013-05-29 LAB — CDS SEROLOGY

## 2014-07-20 ENCOUNTER — Encounter (HOSPITAL_COMMUNITY): Payer: Self-pay | Admitting: Emergency Medicine

## 2014-07-20 ENCOUNTER — Emergency Department (HOSPITAL_COMMUNITY)
Admission: EM | Admit: 2014-07-20 | Discharge: 2014-07-20 | Disposition: A | Discharge status: Home / Self Care | Payer: Medicare Other | Source: Home / Self Care | Attending: Emergency Medicine | Admitting: Emergency Medicine

## 2014-07-20 DIAGNOSIS — Z720 Tobacco use: Secondary | ICD-10-CM

## 2014-07-20 DIAGNOSIS — F13239 Sedative, hypnotic or anxiolytic dependence with withdrawal, unspecified: Secondary | ICD-10-CM | POA: Diagnosis not present

## 2014-07-20 DIAGNOSIS — F1193 Opioid use, unspecified with withdrawal: Secondary | ICD-10-CM

## 2014-07-20 DIAGNOSIS — Z87448 Personal history of other diseases of urinary system: Secondary | ICD-10-CM

## 2014-07-20 DIAGNOSIS — R4182 Altered mental status, unspecified: Secondary | ICD-10-CM | POA: Diagnosis not present

## 2014-07-20 DIAGNOSIS — F1123 Opioid dependence with withdrawal: Secondary | ICD-10-CM | POA: Insufficient documentation

## 2014-07-20 DIAGNOSIS — Z880 Allergy status to penicillin: Secondary | ICD-10-CM

## 2014-07-20 DIAGNOSIS — Z8739 Personal history of other diseases of the musculoskeletal system and connective tissue: Secondary | ICD-10-CM

## 2014-07-20 DIAGNOSIS — Z79899 Other long term (current) drug therapy: Secondary | ICD-10-CM | POA: Insufficient documentation

## 2014-07-20 MED ORDER — OXYCODONE HCL 5 MG PO TABS: 5.0000 mg | ORAL_TABLET | Freq: Four times a day (QID) | ORAL | Status: DC | PRN

## 2014-07-20 MED ORDER — HYDROMORPHONE HCL 1 MG/ML IJ SOLN
1.0000 mg | Freq: Once | INTRAMUSCULAR | Status: AC
Start: 2014-07-20 — End: 2014-07-20
  Administered 2014-07-20: 1 mg via INTRAMUSCULAR
  Filled 2014-07-20: qty 1

## 2014-07-20 NOTE — ED Provider Notes (Signed)
CSN: [id number]     Arrival date & time 07/20/14  0104 History   First MD Initiated Contact with Patient 07/20/14 0154     Chief Complaint  Patient presents with  . Withdrawal     (Consider location/radiation/quality/duration/timing/severity/associated sxs/prior Treatment) Patient is a 58 y.o. male presenting with general illness. The history is provided by the patient.  Illness Location:  Home Quality:  Confusion, withdrawal from opiates Severity:  Moderate Onset quality:  Gradual Duration:  3 days Timing:  Constant Progression:  Worsening Chronicity:  Recurrent Context:  Pharmacy mishandled his prescription, unable to get his opana filled Relieved by:  Nothing Worsened by:  Nothing Associated symptoms: diarrhea   Associated symptoms: no cough and no fever     Past Medical History  Diagnosis Date  . Back pain   . Acute renal failure 03/08/2013   Past Surgical History  Procedure Laterality Date  . Leg surgery      left femur fx   History reviewed. No pertinent family history. History  Substance Use Topics  . Smoking status: Current Every Day Smoker -- 1.00 packs/day for 30 years    Types: Cigarettes  . Smokeless tobacco: Never Used  . Alcohol Use: Yes     Comment: occasionally    Review of Systems  Constitutional: Negative for fever.  Respiratory: Negative for cough.   Gastrointestinal: Positive for diarrhea.  All other systems reviewed and are negative.     Allergies  Penicillins  Home Medications   Prior to Admission medications   Medication Sig Start Date End Date Taking? Authorizing Provider  ALPRAZolam Duanne Moron) 0.5 MG tablet Take 2 tablets (1 mg total) by mouth 3 (three) times daily as needed for anxiety. 03/10/13  Yes Barton Dubois, MD  cyclobenzaprine (FLEXERIL) 10 MG tablet Take 10 mg by mouth 2 (two) times daily as needed. For muscle spasms.   Yes Historical Provider, MD  oxymorphone (OPANA ER) 40 MG 12 hr tablet Take 40 mg by mouth every 12  (twelve) hours.   Yes Historical Provider, MD  feeding supplement (ENSURE COMPLETE) LIQD Take 237 mLs by mouth 3 (three) times daily with meals. Patient not taking: Reported on 07/20/2014 03/10/13   Barton Dubois, MD  Multiple Vitamin (MULTIVITAMIN WITH MINERALS) TABS Take 1 tablet by mouth daily. Patient not taking: Reported on 07/20/2014 03/10/13   Barton Dubois, MD  oxyCODONE (ROXICODONE) 5 MG immediate release tablet Take 1 tablet (5 mg total) by mouth every 6 (six) hours as needed for moderate pain or severe pain. 07/20/14   Evelina Bucy, MD  traMADol (ULTRAM) 50 MG tablet Take 1 tablet (50 mg total) by mouth every 6 (six) hours as needed. Patient not taking: Reported on 07/20/2014 03/10/13   Barton Dubois, MD   BP 147/92 mmHg  Pulse 109  Temp(Src) 98.1 F (36.7 C) (Oral)  Resp 18  SpO2 99% Physical Exam  Constitutional: He is oriented to person, place, and time. He appears well-developed and well-nourished. No distress.  HENT:  Head: Normocephalic and atraumatic.  Mouth/Throat: No oropharyngeal exudate.  Eyes: EOM are normal. Pupils are equal, round, and reactive to light.  Neck: Normal range of motion. Neck supple.  Cardiovascular: Normal rate and regular rhythm.  Exam reveals no friction rub.   No murmur heard. Pulmonary/Chest: Effort normal and breath sounds normal. No respiratory distress. He has no wheezes. He has no rales.  Abdominal: He exhibits no distension. There is no tenderness. There is no rebound.  Musculoskeletal: Normal range of  motion. He exhibits no edema.  Neurological: He is alert and oriented to person, place, and time. No cranial nerve deficit. He exhibits normal muscle tone. Coordination normal.  Occasional confusion, intermittent  Skin: No rash noted. He is not diaphoretic.  Nursing note and vitals reviewed.   ED Course  Procedures (including critical care time) Labs Review Labs Reviewed - No data to display  Imaging Review No results found.   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 He is on chronic Opana. His pharmacy mishandled his prescription. On review of the data base, he chose he is routinely gotten narcotics in the same people from the same pharmacy for a long time. He is a coming by his son who is concerned about his withdrawal. Last year patient had two-week withdrawal which exacerbated some underlying psych conditions which required hospitalization and psych eval. Per son, patient started to show some mild confusion which is consistent with higher episodes of withdrawal. Patient here tachycardic, jittery, restless. Given IM Dilaudid. I will give patient small amount of oxycodone and counseled that he will need to make this last until he can talk to his PCP about a replacement prescription. Patient informed he would unlikely get another prescription from the ER having just gotten one today. Patient's confusion is greatly improved after IM pain medicines. He is acting much better and patient and his son feels stable to go home. Stable for discharge.    Evelina Bucy, MD 07/20/14 [phone]
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
## 2014-07-21 ENCOUNTER — Inpatient Hospital Stay (HOSPITAL_COMMUNITY)
Admission: EM | Admit: 2014-07-21 | Discharge: 2014-07-26 | DRG: 896 | Disposition: A | Discharge status: Home / Self Care | Payer: Medicare Other | Attending: Internal Medicine | Admitting: Internal Medicine

## 2014-07-21 ENCOUNTER — Inpatient Hospital Stay (HOSPITAL_COMMUNITY): Payer: Medicare Other

## 2014-07-21 ENCOUNTER — Encounter (HOSPITAL_COMMUNITY): Payer: Self-pay

## 2014-07-21 DIAGNOSIS — F13231 Sedative, hypnotic or anxiolytic dependence with withdrawal delirium: Secondary | ICD-10-CM | POA: Diagnosis not present

## 2014-07-21 DIAGNOSIS — Z880 Allergy status to penicillin: Secondary | ICD-10-CM | POA: Diagnosis not present

## 2014-07-21 DIAGNOSIS — G894 Chronic pain syndrome: Secondary | ICD-10-CM | POA: Diagnosis present

## 2014-07-21 DIAGNOSIS — E876 Hypokalemia: Secondary | ICD-10-CM | POA: Diagnosis present

## 2014-07-21 DIAGNOSIS — T424X1A Poisoning by benzodiazepines, accidental (unintentional), initial encounter: Secondary | ICD-10-CM | POA: Diagnosis present

## 2014-07-21 DIAGNOSIS — R410 Disorientation, unspecified: Secondary | ICD-10-CM

## 2014-07-21 DIAGNOSIS — G92 Toxic encephalopathy: Secondary | ICD-10-CM | POA: Diagnosis present

## 2014-07-21 DIAGNOSIS — F1123 Opioid dependence with withdrawal: Secondary | ICD-10-CM | POA: Diagnosis present

## 2014-07-21 DIAGNOSIS — R911 Solitary pulmonary nodule: Secondary | ICD-10-CM | POA: Diagnosis present

## 2014-07-21 DIAGNOSIS — R441 Visual hallucinations: Secondary | ICD-10-CM | POA: Diagnosis present

## 2014-07-21 DIAGNOSIS — E440 Moderate protein-calorie malnutrition: Secondary | ICD-10-CM | POA: Diagnosis present

## 2014-07-21 DIAGNOSIS — F17210 Nicotine dependence, cigarettes, uncomplicated: Secondary | ICD-10-CM | POA: Diagnosis present

## 2014-07-21 DIAGNOSIS — F13939 Sedative, hypnotic or anxiolytic use, unspecified with withdrawal, unspecified: Secondary | ICD-10-CM | POA: Diagnosis present

## 2014-07-21 DIAGNOSIS — F419 Anxiety disorder, unspecified: Secondary | ICD-10-CM | POA: Diagnosis present

## 2014-07-21 DIAGNOSIS — F10231 Alcohol dependence with withdrawal delirium: Secondary | ICD-10-CM | POA: Diagnosis present

## 2014-07-21 DIAGNOSIS — F13239 Sedative, hypnotic or anxiolytic dependence with withdrawal, unspecified: Principal | ICD-10-CM | POA: Diagnosis present

## 2014-07-21 DIAGNOSIS — R4182 Altered mental status, unspecified: Secondary | ICD-10-CM | POA: Diagnosis present

## 2014-07-21 DIAGNOSIS — Z515 Encounter for palliative care: Secondary | ICD-10-CM

## 2014-07-21 DIAGNOSIS — R197 Diarrhea, unspecified: Secondary | ICD-10-CM | POA: Diagnosis present

## 2014-07-21 DIAGNOSIS — G8929 Other chronic pain: Secondary | ICD-10-CM | POA: Diagnosis present

## 2014-07-21 DIAGNOSIS — M549 Dorsalgia, unspecified: Secondary | ICD-10-CM | POA: Diagnosis present

## 2014-07-21 DIAGNOSIS — F332 Major depressive disorder, recurrent severe without psychotic features: Secondary | ICD-10-CM | POA: Diagnosis present

## 2014-07-21 DIAGNOSIS — F411 Generalized anxiety disorder: Secondary | ICD-10-CM | POA: Diagnosis present

## 2014-07-21 DIAGNOSIS — I519 Heart disease, unspecified: Secondary | ICD-10-CM | POA: Diagnosis not present

## 2014-07-21 DIAGNOSIS — F1593 Other stimulant use, unspecified with withdrawal: Secondary | ICD-10-CM | POA: Diagnosis present

## 2014-07-21 DIAGNOSIS — F329 Major depressive disorder, single episode, unspecified: Secondary | ICD-10-CM | POA: Diagnosis present

## 2014-07-21 DIAGNOSIS — F10931 Alcohol use, unspecified with withdrawal delirium: Secondary | ICD-10-CM | POA: Diagnosis present

## 2014-07-21 DIAGNOSIS — G9340 Encephalopathy, unspecified: Secondary | ICD-10-CM | POA: Diagnosis present

## 2014-07-21 LAB — CBC WITH DIFFERENTIAL/PLATELET
Basophils Absolute: 0 10*3/uL (ref 0.0–0.1)
Basophils Relative: 0 % (ref 0–1)
EOS PCT: 0 % (ref 0–5)
Eosinophils Absolute: 0 10*3/uL (ref 0.0–0.7)
HEMATOCRIT: 41.1 % (ref 39.0–52.0)
Hemoglobin: 14.1 g/dL (ref 13.0–17.0)
Lymphocytes Relative: 19 % (ref 12–46)
Lymphs Abs: 1.8 10*3/uL (ref 0.7–4.0)
MCH: 31.1 pg (ref 26.0–34.0)
MCHC: 34.3 g/dL (ref 30.0–36.0)
MCV: 90.5 fL (ref 78.0–100.0)
MONO ABS: 1.3 10*3/uL — AB (ref 0.1–1.0)
Monocytes Relative: 13 % — ABNORMAL HIGH (ref 3–12)
Neutro Abs: 6.6 10*3/uL (ref 1.7–7.7)
Neutrophils Relative %: 68 % (ref 43–77)
Platelets: 238 10*3/uL (ref 150–400)
RBC: 4.54 MIL/uL (ref 4.22–5.81)
RDW: 13.5 % (ref 11.5–15.5)
WBC: 9.7 10*3/uL (ref 4.0–10.5)

## 2014-07-21 LAB — COMPREHENSIVE METABOLIC PANEL
ALBUMIN: 4.7 g/dL (ref 3.5–5.2)
ALT: 43 U/L (ref 0–53)
ANION GAP: 18 — AB (ref 5–15)
AST: 137 U/L — ABNORMAL HIGH (ref 0–37)
Alkaline Phosphatase: 76 U/L (ref 39–117)
BILIRUBIN TOTAL: 0.9 mg/dL (ref 0.3–1.2)
BUN: 35 mg/dL — AB (ref 6–23)
CO2: 22 mEq/L (ref 19–32)
CREATININE: 1.03 mg/dL (ref 0.50–1.35)
Calcium: 10.1 mg/dL (ref 8.4–10.5)
Chloride: 97 mEq/L (ref 96–112)
GFR calc Af Amer: >90 mL/min (ref >90)
GFR calc non Af Amer: 78 mL/min — ABNORMAL LOW (ref >90)
Glucose, Bld: 84 mg/dL (ref 70–99)
Potassium: 3.8 mEq/L (ref 3.7–5.3)
Sodium: 137 mEq/L (ref 137–147)
TOTAL PROTEIN: 8.4 g/dL — AB (ref 6.0–8.3)

## 2014-07-21 LAB — ETHANOL: Alcohol, Ethyl (B): <11 mg/dL (ref 0–11)

## 2014-07-21 MED ORDER — CLONIDINE HCL 0.1 MG PO TABS
0.1000 mg | ORAL_TABLET | Freq: Four times a day (QID) | ORAL | Status: DC
  Administered 2014-07-21: 0.1 mg via ORAL
  Filled 2014-07-21: qty 1

## 2014-07-21 MED ORDER — LORAZEPAM 1 MG PO TABS: 1.0000 mg | ORAL_TABLET | Freq: Three times a day (TID) | ORAL | Status: DC | PRN

## 2014-07-21 MED ORDER — DICYCLOMINE HCL 20 MG PO TABS
20.0000 mg | ORAL_TABLET | Freq: Four times a day (QID) | ORAL | Status: AC | PRN
  Filled 2014-07-21: qty 1

## 2014-07-21 MED ORDER — METHOCARBAMOL 500 MG PO TABS: 500.0000 mg | ORAL_TABLET | Freq: Three times a day (TID) | ORAL | Status: AC | PRN

## 2014-07-21 MED ORDER — DIAZEPAM 5 MG/ML IJ SOLN
10.0000 mg | Freq: Once | INTRAMUSCULAR | Status: AC
  Administered 2014-07-21: 10 mg via INTRAVENOUS
  Filled 2014-07-21: qty 2

## 2014-07-21 MED ORDER — LOPERAMIDE HCL 2 MG PO CAPS: 2.0000 mg | ORAL_CAPSULE | ORAL | Status: DC | PRN

## 2014-07-21 MED ORDER — HYDROXYZINE HCL 25 MG PO TABS
25.0000 mg | ORAL_TABLET | Freq: Four times a day (QID) | ORAL | Status: AC | PRN
  Administered 2014-07-25: 25 mg via ORAL
  Filled 2014-07-21 (×2): qty 1

## 2014-07-21 MED ORDER — LORAZEPAM 2 MG/ML IJ SOLN
1.0000 mg | Freq: Once | INTRAMUSCULAR | Status: AC
  Administered 2014-07-21: 1 mg via INTRAVENOUS
  Filled 2014-07-21: qty 1

## 2014-07-21 MED ORDER — ACETAMINOPHEN 325 MG PO TABS: 650.0000 mg | ORAL_TABLET | ORAL | Status: DC | PRN

## 2014-07-21 MED ORDER — ZIPRASIDONE MESYLATE 20 MG IM SOLR
10.0000 mg | Freq: Once | INTRAMUSCULAR | Status: AC
  Administered 2014-07-21: 10 mg via INTRAMUSCULAR
  Filled 2014-07-21: qty 20

## 2014-07-21 MED ORDER — CLONIDINE HCL 0.1 MG PO TABS: 0.1000 mg | ORAL_TABLET | Freq: Every day | ORAL | Status: DC

## 2014-07-21 MED ORDER — ALUM & MAG HYDROXIDE-SIMETH 200-200-20 MG/5ML PO SUSP: 30.0000 mL | ORAL | Status: DC | PRN

## 2014-07-21 MED ORDER — LORAZEPAM 2 MG/ML IJ SOLN
0.0000 mg | Freq: Four times a day (QID) | INTRAMUSCULAR | Status: AC
  Administered 2014-07-22: 2 mg via INTRAVENOUS
  Administered 2014-07-22: 4 mg via INTRAVENOUS
  Administered 2014-07-22: 2 mg via INTRAVENOUS
  Administered 2014-07-22 – 2014-07-23 (×2): 4 mg via INTRAVENOUS
  Filled 2014-07-21 (×3): qty 1
  Filled 2014-07-21 (×3): qty 2

## 2014-07-21 MED ORDER — NAPROXEN 500 MG PO TABS
500.0000 mg | ORAL_TABLET | Freq: Two times a day (BID) | ORAL | Status: DC | PRN
  Filled 2014-07-21: qty 1

## 2014-07-21 MED ORDER — LORAZEPAM 2 MG/ML IJ SOLN
0.0000 mg | Freq: Two times a day (BID) | INTRAMUSCULAR | Status: AC
  Administered 2014-07-22: 4 mg via INTRAVENOUS
  Filled 2014-07-21: qty 1
  Filled 2014-07-21: qty 2

## 2014-07-21 MED ORDER — STERILE WATER FOR INJECTION IJ SOLN
INTRAMUSCULAR | Status: AC
Start: 2014-07-21 — End: 2014-07-21
  Administered 2014-07-21: 10 mL
  Filled 2014-07-21: qty 10

## 2014-07-21 MED ORDER — ONDANSETRON HCL 4 MG PO TABS: 4.0000 mg | ORAL_TABLET | Freq: Three times a day (TID) | ORAL | Status: DC | PRN

## 2014-07-21 MED ORDER — NICOTINE 21 MG/24HR TD PT24
21.0000 mg | MEDICATED_PATCH | Freq: Every day | TRANSDERMAL | Status: DC
  Administered 2014-07-22 – 2014-07-26 (×5): 21 mg via TRANSDERMAL
  Filled 2014-07-21 (×5): qty 1

## 2014-07-21 MED ORDER — LORAZEPAM 1 MG PO TABS
0.0000 mg | ORAL_TABLET | Freq: Two times a day (BID) | ORAL | Status: DC
  Administered 2014-07-21: 1 mg via ORAL
  Administered 2014-07-24: 2 mg via ORAL
  Filled 2014-07-21 (×2): qty 2

## 2014-07-21 MED ORDER — IBUPROFEN 200 MG PO TABS: 600.0000 mg | ORAL_TABLET | Freq: Three times a day (TID) | ORAL | Status: DC | PRN

## 2014-07-21 MED ORDER — LORAZEPAM 2 MG/ML IJ SOLN
2.0000 mg | Freq: Once | INTRAMUSCULAR | Status: AC
  Administered 2014-07-21: 2 mg via INTRAVENOUS

## 2014-07-21 MED ORDER — ZIPRASIDONE MESYLATE 20 MG IM SOLR: 10.0000 mg | Freq: Once | INTRAMUSCULAR | Status: DC

## 2014-07-21 MED ORDER — DIPHENHYDRAMINE HCL 50 MG/ML IJ SOLN
50.0000 mg | Freq: Once | INTRAMUSCULAR | Status: AC
  Administered 2014-07-21: 50 mg via INTRAVENOUS
  Filled 2014-07-21: qty 1

## 2014-07-21 MED ORDER — ZOLPIDEM TARTRATE 5 MG PO TABS: 5.0000 mg | ORAL_TABLET | Freq: Every evening | ORAL | Status: DC | PRN

## 2014-07-21 MED ORDER — CLONIDINE HCL 0.1 MG PO TABS: 0.1000 mg | ORAL_TABLET | ORAL | Status: DC

## 2014-07-21 MED ORDER — LORAZEPAM 1 MG PO TABS: 0.0000 mg | ORAL_TABLET | Freq: Four times a day (QID) | ORAL | Status: AC

## 2014-07-21 NOTE — ED Provider Notes (Signed)
CSN: [id number]     Arrival date & time 07/21/14  1436 History   First MD Initiated Contact with Patient 07/21/14 1624     Chief Complaint  Patient presents with  . Medication Refill  . Tachycardia     (Consider location/radiation/quality/duration/timing/severity/associated sxs/prior Treatment) HPI Comments: Patient is a 58 year old male with history of chronic back pain and schizophrenia who presents the emergency department today for evaluation of hallucinations. He reports that there was a mixup with the pharmacy and he has been unable to get his Opana and Xanax filled. He has not had either these medications in approximately 1 week. He has associated diarrhea. He has been having intermittent hallucinations and believes there is a dead baby in a bucket in the room. He wanders off the property where is living and can get lost in the woods. He also drove his car and drove it off the road. His symptoms wax and wane. He was seen yesterday and given IM Dilaudid and a prescription for oxycodone which he has been taking as prescribed.   The history is provided by the patient and a relative. No language interpreter was used.    Past Medical History  Diagnosis Date  . Back pain   . Acute renal failure 03/08/2013   Past Surgical History  Procedure Laterality Date  . Leg surgery      left femur fx   History reviewed. No pertinent family history. History  Substance Use Topics  . Smoking status: Current Every Day Smoker -- 1.00 packs/day for 30 years    Types: Cigarettes  . Smokeless tobacco: Never Used  . Alcohol Use: Yes     Comment: occasionally    Review of Systems  Constitutional: Negative for fever and chills.  Respiratory: Negative for shortness of breath.   Cardiovascular: Negative for chest pain.  Gastrointestinal: Positive for diarrhea. Negative for nausea and vomiting.  Psychiatric/Behavioral: Positive for hallucinations.  All other systems reviewed and are  negative.     Allergies  Penicillins  Home Medications   Prior to Admission medications   Medication Sig Start Date End Date Taking? Authorizing Provider  ALPRAZolam Duanne Moron) 0.5 MG tablet Take 2 tablets (1 mg total) by mouth 3 (three) times daily as needed for anxiety. 03/10/13   Barton Dubois, MD  cyclobenzaprine (FLEXERIL) 10 MG tablet Take 10 mg by mouth 2 (two) times daily as needed. For muscle spasms.    Historical Provider, MD  feeding supplement (ENSURE COMPLETE) LIQD Take 237 mLs by mouth 3 (three) times daily with meals. Patient not taking: Reported on 07/20/2014 03/10/13   Barton Dubois, MD  Multiple Vitamin (MULTIVITAMIN WITH MINERALS) TABS Take 1 tablet by mouth daily. Patient not taking: Reported on 07/20/2014 03/10/13   Barton Dubois, MD  oxyCODONE (ROXICODONE) 5 MG immediate release tablet Take 1 tablet (5 mg total) by mouth every 6 (six) hours as needed for moderate pain or severe pain. 07/20/14   Evelina Bucy, MD  oxymorphone (OPANA ER) 40 MG 12 hr tablet Take 40 mg by mouth every 12 (twelve) hours.    Historical Provider, MD  traMADol (ULTRAM) 50 MG tablet Take 1 tablet (50 mg total) by mouth every 6 (six) hours as needed. Patient not taking: Reported on 07/20/2014 03/10/13   Barton Dubois, MD   BP 129/90 mmHg  Pulse 115  Temp(Src) 98 F (36.7 C) (Oral)  Resp 22  SpO2 99% Physical Exam  Constitutional: He is oriented to person, place, and time. He appears well-developed  and well-nourished. No distress.  HENT:  Head: Normocephalic and atraumatic.  Right Ear: External ear normal.  Left Ear: External ear normal.  Nose: Nose normal.  Mouth/Throat: Oropharynx is clear and moist.  Eyes: Conjunctivae are normal.  Neck: Normal range of motion. Neck supple. No tracheal deviation present.  Cardiovascular: Regular rhythm and normal heart sounds.  Tachycardia present.   Pulmonary/Chest: Effort normal and breath sounds normal. No stridor.  Abdominal: Soft. He exhibits no  distension. There is no tenderness.  Musculoskeletal: Normal range of motion.  Neurological: He is alert and oriented to person, place, and time.  Skin: Skin is warm and dry. He is not diaphoretic.  Psychiatric: He has a normal mood and affect. His speech is slurred. He is actively hallucinating.  Speech is intermittently slurred   Nursing note and vitals reviewed.   ED Course  Procedures (including critical care time) Labs Review Labs Reviewed  CBC WITH DIFFERENTIAL - Abnormal; Notable for the following:    Monocytes Relative 13 (*)    Monocytes Absolute 1.3 (*)    All other components within normal limits  COMPREHENSIVE METABOLIC PANEL - Abnormal; Notable for the following:    BUN 35 (*)    Total Protein 8.4 (*)    AST 137 (*)    GFR calc non Af Amer 78 (*)    Anion gap 18 (*)    All other components within normal limits  MAGNESIUM - Abnormal; Notable for the following:    Magnesium 1.3 (*)    All other components within normal limits  PHOSPHORUS - Abnormal; Notable for the following:    Phosphorus 2.2 (*)    All other components within normal limits  MRSA PCR SCREENING  ETHANOL  LACTIC ACID, PLASMA  URINE RAPID DRUG SCREEN (HOSP PERFORMED)  MAGNESIUM  PHOSPHORUS  TSH  COMPREHENSIVE METABOLIC PANEL  CBC  TROPONIN I  TROPONIN I  TROPONIN I  HEMOGLOBIN A1C  URINALYSIS, ROUTINE W REFLEX MICROSCOPIC  HIV ANTIBODY (ROUTINE TESTING)  HEPATITIS PANEL, ACUTE    Imaging Review Ct Head Wo Contrast  07/21/2014   CLINICAL DATA:  58 year old male with altered mental status. Thought secondary to opioid and benzodiazepine withdrawal. Actively hallucinating and not following commands. Evaluate for organic disease.  EXAM: CT HEAD WITHOUT CONTRAST  CT CERVICAL SPINE WITHOUT CONTRAST  TECHNIQUE: Multidetector CT imaging of the head and cervical spine was performed following the standard protocol without intravenous contrast. Multiplanar CT image reconstructions of the cervical spine  were also generated.  COMPARISON:  Prior CT scan of the head and cervical spine 05/27/2013  FINDINGS: CT HEAD FINDINGS  Negative for acute intracranial hemorrhage, acute infarction, mass, mass effect, hydrocephalus or midline shift. Gray-white differentiation is preserved throughout. No focal soft tissue or calvarial abnormality. Globes and orbits are symmetric bilaterally. Normal aeration of the mastoid air cells and visualized paranasal sinuses. Trace atherosclerotic calcification in the bilateral cavernous carotid arteries.  CT CERVICAL SPINE FINDINGS  No acute fracture, malalignment or prevertebral soft tissue swelling. Multilevel cervical spondylosis. Most significant posterior disc osteophyte complex at C3-C4 and C6-C7. At both levels there is likely moderate central canal stenosis. Unremarkable CT appearance of the thyroid gland. No acute soft tissue abnormality. Mild biapical emphysematous change.  IMPRESSION: CT HEAD  1. No acute intracranial abnormality. CT CSPINE  1. No acute fracture or malalignment. 2. Multilevel cervical spondylosis with at least moderate narrowing of the central canal at C3-C4 and C6-C7 secondary to posterior disc osteophyte complexes. 3. Emphysematous change  in the visualized upper lungs.   Electronically Signed   By: Jacqulynn Cadet M.D.   On: 07/21/2014 23:26   Ct Cervical Spine Wo Contrast  07/21/2014   CLINICAL DATA:  58 year old male with altered mental status. Thought secondary to opioid and benzodiazepine withdrawal. Actively hallucinating and not following commands. Evaluate for organic disease.  EXAM: CT HEAD WITHOUT CONTRAST  CT CERVICAL SPINE WITHOUT CONTRAST  TECHNIQUE: Multidetector CT imaging of the head and cervical spine was performed following the standard protocol without intravenous contrast. Multiplanar CT image reconstructions of the cervical spine were also generated.  COMPARISON:  Prior CT scan of the head and cervical spine 05/27/2013  FINDINGS: CT HEAD  FINDINGS  Negative for acute intracranial hemorrhage, acute infarction, mass, mass effect, hydrocephalus or midline shift. Gray-white differentiation is preserved throughout. No focal soft tissue or calvarial abnormality. Globes and orbits are symmetric bilaterally. Normal aeration of the mastoid air cells and visualized paranasal sinuses. Trace atherosclerotic calcification in the bilateral cavernous carotid arteries.  CT CERVICAL SPINE FINDINGS  No acute fracture, malalignment or prevertebral soft tissue swelling. Multilevel cervical spondylosis. Most significant posterior disc osteophyte complex at C3-C4 and C6-C7. At both levels there is likely moderate central canal stenosis. Unremarkable CT appearance of the thyroid gland. No acute soft tissue abnormality. Mild biapical emphysematous change.  IMPRESSION: CT HEAD  1. No acute intracranial abnormality. CT CSPINE  1. No acute fracture or malalignment. 2. Multilevel cervical spondylosis with at least moderate narrowing of the central canal at C3-C4 and C6-C7 secondary to posterior disc osteophyte complexes. 3. Emphysematous change in the visualized upper lungs.   Electronically Signed   By: Jacqulynn Cadet M.D.   On: 07/21/2014 23:26   Dg Chest Port 1 View  07/21/2014   CLINICAL DATA:  58 year old male with altered mental status, combative  EXAM: PORTABLE CHEST - 1 VIEW  COMPARISON:  CT scan chest/abdomen/ pelvis 05/27/2013  FINDINGS: Cardiac and mediastinal contours are within normal limits. Slightly low inspiratory volumes, possible expiratory phase timing of the radiograph. No focal airspace consolidation, pleural effusion or pneumothorax. No pulmonary edema. 11 mm nodular opacity projects between the left sixth and seventh ribs. No acute osseous abnormality.  IMPRESSION: 1. No active cardiopulmonary process. 2. Possible 11 mm pulmonary nodule in the left mid to upper lung. Recommend repeat dedicated PA and lateral chest x-ray when the patient is able to  breath hold. If the nodular opacity persists, chest CT may become warranted.   Electronically Signed   By: Jacqulynn Cadet M.D.   On: 07/21/2014 23:56     EKG Interpretation None      MDM   Final diagnoses:  Altered mental status   Patient presents emergency department for evaluation of benzodiazepine and opiate withdrawal. He has not had his Xanax and Opana in 1 week due to a mix up at the pharmacy. He was seen yesterday and was given IM Dilaudid and rx for oxycodone which he has been taking as prescribed. Today he is having worsening hallucinations and abnormal behavior. These are intermittent, but worsening. He had a similar issue last year when he was withdrawing from benzodiazepines. He has remote history of schizophrenia which he does not take medications for, but his son feels like gets triggered when he is withdrawing from these medications. I believe it is in the patient's best interest to admit him to the hospital for benzo withdrawal and altered mental status.   Patient appears to worsen. He is becoming more agitated,  wandering around in the halls and threatening to leave. Patient is not competent to make his own decisions. Given the potentially fatal effects of benzodiazepine withdrawal he is a danger to himself. Given altered mental status and agitated state he is potentially a danger others. Patient was IVC'd and patient was redirected to his room by [employer].   Patient given Geodon, ativan, benadryl, and valium before he was compliant with CT head and cervical spine. CT head shows not acute pathology. Patient was transferred to ICU with stable vital signs. Discussed case with Dr. Wilson Singer who agrees with plan.     Elwyn Lade, PA-C 07/22/14 2162  Virgel Manifold, MD 07/22/14 1640

## 2014-07-21 NOTE — ED Notes (Addendum)
Pt has one belongings bag including a pair of blue jeans, a t-shirt, a hat, a pair of black socks, a pair of boots, 2 lighters in a jeans pocket, and 6 20 dollar bills ($120 total).

## 2014-07-21 NOTE — H&P (Signed)
PCP:   [name], MD    Chief Complaint: Patient confused and hallucinating   HPI: Dale [name] is a 58 y.o. male   has a past medical history of Back pain and Acute renal failure (03/08/2013).   Presented with  Patient is unable to provide any of his own history. He was brought in by his son due to opioid and benzodiazepine withdrawal. Patient apparently takes Xanax and opana at home would by pain clinic. Somehow his prescriptions became misplaced and he was not able to fill them. A shunt for seen yesterday in emergency department was treated with IM Dilaudid and given prescriptions for oxycodone. He was supposed to follow-up with his primary care provider. Patient returned to emergency department today with worsening confusion and delirium. Patient actively hallucinating. Patient was being evaluated when he became non-redirectable was witnessed hitting his head and wandering the halls. At that point in emergency department had IVC patient and [employer] was called. Patient was restrained. Currently laying on the bed continues to actively hallucinate not following any commands. No family at bedside. Apparently prior to this patient's son was asked to leave the premises after repeatedly attempting to video-tape which is against [place] regulations. Apparently patient has history of psychiatric illness which does become exacerbated after withdrawing from chronic medications   Hospitalist was called for admission for delirium likely secondary to benzodiazepine withdrawal  Review of Systems:  Unable to obtain as patient is confused Past Medical History: Past Medical History  Diagnosis Date  . Back pain   . Acute renal failure 03/08/2013   Past Surgical History  Procedure Laterality Date  . Leg surgery      left femur fx     Medications: Prior to Admission medications   Medication Sig Start Date End Date Taking? Authorizing Provider  ALPRAZolam Duanne Moron) 1 MG tablet Take 1 mg by mouth  3 (three) times daily as needed for anxiety.  06/17/14  Yes Historical Provider, MD  cyclobenzaprine (FLEXERIL) 10 MG tablet Take 10 mg by mouth 2 (two) times daily as needed. For muscle spasms.   Yes Historical Provider, MD  DULoxetine (CYMBALTA) 60 MG capsule Take 60 mg by mouth daily. 07/13/14  Yes Historical Provider, MD  oxyCODONE (ROXICODONE) 5 MG immediate release tablet Take 1 tablet (5 mg total) by mouth every 6 (six) hours as needed for moderate pain or severe pain. 07/20/14  Yes Evelina Bucy, MD  oxymorphone (OPANA ER) 40 MG 12 hr tablet Take 40 mg by mouth every 12 (twelve) hours.   Yes Historical Provider, MD  feeding supplement (ENSURE COMPLETE) LIQD Take 237 mLs by mouth 3 (three) times daily with meals. Patient not taking: Reported on 07/20/2014 03/10/13   Barton Dubois, MD  Multiple Vitamin (MULTIVITAMIN WITH MINERALS) TABS Take 1 tablet by mouth daily. Patient not taking: Reported on 07/20/2014 03/10/13   Barton Dubois, MD  oxymorphone [facility] ER) 40 MG 12 hr tablet Take 40 mg by mouth every 12 (twelve) hours.    Historical Provider, MD  traMADol (ULTRAM) 50 MG tablet Take 1 tablet (50 mg total) by mouth every 6 (six) hours as needed. Patient not taking: Reported on 07/20/2014 03/10/13   Barton Dubois, MD    Allergies:   Allergies  Allergen Reactions  . Penicillins Hives    Social History:  Ambulatory  independently       reports that he has been smoking Cigarettes.  He has a 30 pack-year smoking history. He has never used smokeless tobacco.  He reports that he drinks alcohol. He reports that he does not use illicit drugs.    Family History: Family history is unknown by patient.    Physical Exam: Patient Vitals for the past 24 hrs:  BP Temp Temp src Pulse Resp SpO2  07/21/14 1444 129/90 mmHg 98 F (36.7 C) Oral 115 22 99 %    1. General:  in No Acute distress 2. Psychological: Alert but not Oriented combative actively hallucinating 3. Head/ENT:    Dry Mucous  Membranes                          Head Non traumatic, neck supple                          Normal  Dentition 4. SKIN:  decreased Skin turgor,  Skin clean Dry and intact no rash 5. Heart: Regular rate and rhythm no Murmur, Rub or gallop 6. Lungs: Clear to auscultation bilaterally, no wheezes or crackles   7. Abdomen: Soft, non-tender, Non distended 8. Lower extremities: no clubbing, cyanosis, or edema 9. Neurologically patient noncooperative exam but able to move all his extremities spontaneously 10. MSK: Normal range of motion  body mass index is unknown because there is no weight on file.   Labs on Admission:   Results for orders placed or performed during the hospital encounter of 07/21/14 (from the past 24 hour(s))  CBC WITH DIFFERENTIAL     Status: Abnormal   Collection Time: 07/21/14  5:02 PM  Result Value Ref Range   WBC 9.7 4.0 - 10.5 K/uL   RBC 4.54 4.22 - 5.81 MIL/uL   Hemoglobin 14.1 13.0 - 17.0 g/dL   HCT 41.1 39.0 - 52.0 %   MCV 90.5 78.0 - 100.0 fL   MCH 31.1 26.0 - 34.0 pg   MCHC 34.3 30.0 - 36.0 g/dL   RDW 13.5 11.5 - 15.5 %   Platelets 238 150 - 400 K/uL   Neutrophils Relative % 68 43 - 77 %   Neutro Abs 6.6 1.7 - 7.7 K/uL   Lymphocytes Relative 19 12 - 46 %   Lymphs Abs 1.8 0.7 - 4.0 K/uL   Monocytes Relative 13 (H) 3 - 12 %   Monocytes Absolute 1.3 (H) 0.1 - 1.0 K/uL   Eosinophils Relative 0 0 - 5 %   Eosinophils Absolute 0.0 0.0 - 0.7 K/uL   Basophils Relative 0 0 - 1 %   Basophils Absolute 0.0 0.0 - 0.1 K/uL  Comprehensive metabolic panel     Status: Abnormal   Collection Time: 07/21/14  5:02 PM  Result Value Ref Range   Sodium 137 137 - 147 mEq/L   Potassium 3.8 3.7 - 5.3 mEq/L   Chloride 97 96 - 112 mEq/L   CO2 22 19 - 32 mEq/L   Glucose, Bld 84 70 - 99 mg/dL   BUN 35 (H) 6 - 23 mg/dL   Creatinine, Ser 1.03 0.50 - 1.35 mg/dL   Calcium 10.1 8.4 - 10.5 mg/dL   Total Protein 8.4 (H) 6.0 - 8.3 g/dL   Albumin 4.7 3.5 - 5.2 g/dL   AST 137 (H) 0 -  37 U/L   ALT 43 0 - 53 U/L   Alkaline Phosphatase 76 39 - 117 U/L   Total Bilirubin 0.9 0.3 - 1.2 mg/dL   GFR calc non Af Amer 78 (L) >90 mL/min   GFR calc  Af Amer >90 >90 mL/min   Anion gap 18 (H) 5 - 15  Ethanol     Status: None   Collection Time: 07/21/14  5:02 PM  Result Value Ref Range   Alcohol, Ethyl (B) <11 0 - 11 mg/dL    No results found for: HGBA1C  CrCl cannot be calculated (Unknown ideal weight.).  BNP (last 3 results) No results for input(s): PROBNP in the last 8760 hours.  Other results:  I have pearsonaly reviewed this: ECG REPORT  Rate: 106  Rhythm: Sinus tachycardia ST&T Change: No evidence of ischemia   There were no vitals filed for this visit.   Cultures: No results found for: [place], [place], [place], REPTSTATUS   Radiological Exams on Admission: No results found.  Chart has been reviewed  Assessment/Plan  58 year old gentleman on chronic narcotics through pain clinic as well as chronic prescription for benzodiazepines. Presents with delirium likely secondary to withdrawal  Present on Admission:  . Benzodiazepine withdrawal - difficult history to obtain as patient is currently confused and unable to answer questions no family is at bedside. We'll admit the step down for benzodiazepine withdrawal resulting in delirium. Sitter at bedside will order CIWA, continue to monitor and if needs Precedex will consult [facility]  . Delirium tremens - unknown if patient has history of alcohol abuse. But currently undergoing combination of opioid and benzodiazepine withdrawal. Given confusion labs a CT of the head Opioid withdrawal - patient has chronic pain and is unclear if he wishes to discontinue the use of his chronic narcotics. At this point will treat pain as needed while patient is too altered to take by mouth we'll give IV once patient's mental status improves we'll need to discuss his long-term goals for chronic narcotic use  Prophylaxis: SCD until CT of  the head is done, Protonix  CODE STATUS:  FULL CODE  Other plan as per orders.  I have spent a total of 65 min on this admission  Elzabeth Mcquerry 07/21/2014, 8:54 PM  [facility]  Pager [phone]   after 2 AM please page floor coverage PA If 7AM-7PM, please contact the day team taking care of the patient  [email or web address]  Password [email or web address]

## 2014-07-21 NOTE — ED Notes (Signed)
Pt son spoken to at length by [facility] in psych ED.  Pt informed of procedure.  Pt son continues to question plan.  Plan for admit explained.  Family becoming very anxious and pt symptoms getting worse.  Pt moved to room to hold for admit.

## 2014-07-21 NOTE — ED Notes (Signed)
Patient wanded by security. 

## 2014-07-21 NOTE — ED Notes (Signed)
Staffing office notified of need for safety sitter. AC notified of need for safety sitter per staffing office.

## 2014-07-21 NOTE — ED Notes (Addendum)
Per pt and son, pt being seen at pain clinic. Gets monthly script.  Pt went to [facility] and rec'd this months script.  However, when he took it to [facility] it was 2 days early.  Left script.  Pt states when he went back 2 days later, [facility] stated script had not been left.  Came to hospital yesterday and Mingo Amber willing to give him 30 oxycodone 5 mg.  Pt has been taking 1 every 6 hours but not helping detox symptoms.  Son is concerned for psychosis.  Pt has had previous hx of psych problems when coming off meds

## 2014-07-21 NOTE — ED Notes (Signed)
Patient was wondering halls, patient tried to enter radiology room. Staff, [employer] and security tried to redirect patient. Patient was not compliant. Patient is hallucinating. Patient was then handcuffed by [employer] and taken down to his knees as patient is currently being IVC'd. Patient was then placed onto stretcher. Patients son was videotaping the event and instructed by security to stop and delete the video. Patients son was asked to leave the property.

## 2014-07-21 NOTE — ED Notes (Signed)
Few minutes after pt was brought in the room pt started yelling and attempted to leave, his son was following him trying to make him come back to room, Jarrett Soho PA was notified and she informed the staff that pt will be IVC'd since he is danger to himself. Off duty Engineer, structural and nursing staff stopped pt in the hallway, handcuffs was placed on pt, charge nurse explained to pt's son we are doing what is necessary to keep him safe, pt is attempting to bang his head on the nearby door; pt was helped into stretcher. During this time pt's son got very upset and started yelling at staff "why were you waiting for five hours to do anything?" and he was observed recording nursing staff with his cell phone. Pt's son was told multiple times that due to [place] laws no video recording is allowed in the hospital, he refused to abide by this rule and was asked to leave the premises.

## 2014-07-22 DIAGNOSIS — I519 Heart disease, unspecified: Secondary | ICD-10-CM

## 2014-07-22 DIAGNOSIS — E876 Hypokalemia: Secondary | ICD-10-CM

## 2014-07-22 DIAGNOSIS — G9340 Encephalopathy, unspecified: Secondary | ICD-10-CM | POA: Diagnosis present

## 2014-07-22 DIAGNOSIS — R911 Solitary pulmonary nodule: Secondary | ICD-10-CM

## 2014-07-22 DIAGNOSIS — F10231 Alcohol dependence with withdrawal delirium: Secondary | ICD-10-CM

## 2014-07-22 LAB — CBC
HEMATOCRIT: 38.6 % — AB (ref 39.0–52.0)
HEMOGLOBIN: 13.2 g/dL (ref 13.0–17.0)
MCH: 30.7 pg (ref 26.0–34.0)
MCHC: 34.2 g/dL (ref 30.0–36.0)
MCV: 89.8 fL (ref 78.0–100.0)
Platelets: 215 10*3/uL (ref 150–400)
RBC: 4.3 MIL/uL (ref 4.22–5.81)
RDW: 13.4 % (ref 11.5–15.5)
WBC: 8.9 10*3/uL (ref 4.0–10.5)

## 2014-07-22 LAB — COMPREHENSIVE METABOLIC PANEL
ALBUMIN: 4.2 g/dL (ref 3.5–5.2)
ALK PHOS: 65 U/L (ref 39–117)
ALT: 45 U/L (ref 0–53)
AST: 159 U/L — AB (ref 0–37)
Anion gap: 18 — ABNORMAL HIGH (ref 5–15)
BUN: 31 mg/dL — ABNORMAL HIGH (ref 6–23)
CO2: 21 mEq/L (ref 19–32)
Calcium: 9.5 mg/dL (ref 8.4–10.5)
Chloride: 101 mEq/L (ref 96–112)
Creatinine, Ser: 0.92 mg/dL (ref 0.50–1.35)
GFR calc non Af Amer: >90 mL/min (ref >90)
GLUCOSE: 85 mg/dL (ref 70–99)
POTASSIUM: 3.4 meq/L — AB (ref 3.7–5.3)
Sodium: 140 mEq/L (ref 137–147)
Total Bilirubin: 1 mg/dL (ref 0.3–1.2)
Total Protein: 7.3 g/dL (ref 6.0–8.3)

## 2014-07-22 LAB — HEPATITIS PANEL, ACUTE
HCV Ab: REACTIVE — AB
HEP B S AG: NEGATIVE
Hep A IgM: NONREACTIVE
Hep B C IgM: NONREACTIVE

## 2014-07-22 LAB — URINALYSIS, ROUTINE W REFLEX MICROSCOPIC
Glucose, UA: NEGATIVE mg/dL
Hgb urine dipstick: NEGATIVE
KETONES UR: 40 mg/dL — AB
LEUKOCYTES UA: NEGATIVE
NITRITE: NEGATIVE
Protein, ur: NEGATIVE mg/dL
SPECIFIC GRAVITY, URINE: 1.028 (ref 1.005–1.030)
Urobilinogen, UA: 1 mg/dL (ref 0.0–1.0)
pH: 6 (ref 5.0–8.0)

## 2014-07-22 LAB — PHOSPHORUS
PHOSPHORUS: 2.2 mg/dL — AB (ref 2.3–4.6)
PHOSPHORUS: 2.4 mg/dL (ref 2.3–4.6)

## 2014-07-22 LAB — MAGNESIUM
Magnesium: 1.3 mg/dL — ABNORMAL LOW (ref 1.5–2.5)
Magnesium: 1.4 mg/dL — ABNORMAL LOW (ref 1.5–2.5)

## 2014-07-22 LAB — HEMOGLOBIN A1C
Hgb A1c MFr Bld: 5.9 % — ABNORMAL HIGH (ref <5.7)
MEAN PLASMA GLUCOSE: 123 mg/dL — AB (ref <117)

## 2014-07-22 LAB — TROPONIN I
Troponin I: <0.3 ng/mL (ref <0.30)
Troponin I: <0.3 ng/mL (ref <0.30)
Troponin I: <0.3 ng/mL (ref <0.30)

## 2014-07-22 LAB — RAPID URINE DRUG SCREEN, HOSP PERFORMED
Amphetamines: NOT DETECTED
BENZODIAZEPINES: NOT DETECTED
Barbiturates: NOT DETECTED
Cocaine: NOT DETECTED
OPIATES: NOT DETECTED
Tetrahydrocannabinol: NOT DETECTED

## 2014-07-22 LAB — TSH: TSH: 1.8 u[IU]/mL (ref 0.350–4.500)

## 2014-07-22 LAB — HIV ANTIBODY (ROUTINE TESTING W REFLEX): HIV 1&2 Ab, 4th Generation: NONREACTIVE

## 2014-07-22 LAB — LACTIC ACID, PLASMA: LACTIC ACID, VENOUS: 0.9 mmol/L (ref 0.5–2.2)

## 2014-07-22 LAB — MRSA PCR SCREENING: MRSA BY PCR: NEGATIVE

## 2014-07-22 MED ORDER — ONDANSETRON HCL 4 MG PO TABS: 4.0000 mg | ORAL_TABLET | Freq: Four times a day (QID) | ORAL | Status: DC | PRN

## 2014-07-22 MED ORDER — ALPRAZOLAM 1 MG PO TABS: 1.0000 mg | ORAL_TABLET | Freq: Three times a day (TID) | ORAL | Status: DC | PRN

## 2014-07-22 MED ORDER — SODIUM CHLORIDE 0.9 % IV SOLN
INTRAVENOUS | Status: DC
  Administered 2014-07-22: 19:00:00 via INTRAVENOUS

## 2014-07-22 MED ORDER — DOCUSATE SODIUM 100 MG PO CAPS
100.0000 mg | ORAL_CAPSULE | Freq: Two times a day (BID) | ORAL | Status: DC
Start: 2014-07-22 — End: 2014-07-22

## 2014-07-22 MED ORDER — POTASSIUM CHLORIDE 10 MEQ/100ML IV SOLN
10.0000 meq | INTRAVENOUS | Status: AC
  Administered 2014-07-22 (×3): 10 meq via INTRAVENOUS
  Filled 2014-07-22: qty 100

## 2014-07-22 MED ORDER — TRAMADOL HCL 50 MG PO TABS
50.0000 mg | ORAL_TABLET | Freq: Four times a day (QID) | ORAL | Status: DC | PRN
  Administered 2014-07-24 – 2014-07-25 (×4): 50 mg via ORAL
  Filled 2014-07-22 (×4): qty 1

## 2014-07-22 MED ORDER — ACETAMINOPHEN 325 MG PO TABS
650.0000 mg | ORAL_TABLET | Freq: Four times a day (QID) | ORAL | Status: DC | PRN
  Administered 2014-07-24 (×3): 650 mg via ORAL
  Filled 2014-07-22 (×3): qty 2

## 2014-07-22 MED ORDER — SODIUM CHLORIDE 0.9 % IJ SOLN: 3.0000 mL | Freq: Two times a day (BID) | INTRAMUSCULAR | Status: DC

## 2014-07-22 MED ORDER — FOLIC ACID 5 MG/ML IJ SOLN
1.0000 mg | Freq: Every day | INTRAMUSCULAR | Status: DC
  Administered 2014-07-22 – 2014-07-25 (×4): 1 mg via INTRAVENOUS
  Filled 2014-07-22 (×5): qty 0.2

## 2014-07-22 MED ORDER — MAGNESIUM SULFATE 2 GM/50ML IV SOLN
2.0000 g | Freq: Once | INTRAVENOUS | Status: AC
  Administered 2014-07-22: 2 g via INTRAVENOUS
  Filled 2014-07-22: qty 50

## 2014-07-22 MED ORDER — SODIUM CHLORIDE 0.9 % IJ SOLN
3.0000 mL | Freq: Two times a day (BID) | INTRAMUSCULAR | Status: DC
  Administered 2014-07-23 – 2014-07-26 (×7): 3 mL via INTRAVENOUS

## 2014-07-22 MED ORDER — DULOXETINE HCL 60 MG PO CPEP
60.0000 mg | ORAL_CAPSULE | Freq: Every day | ORAL | Status: DC
  Administered 2014-07-22 – 2014-07-26 (×4): 60 mg via ORAL
  Filled 2014-07-22 (×2): qty 1
  Filled 2014-07-22 (×2): qty 2
  Filled 2014-07-22: qty 1

## 2014-07-22 MED ORDER — OXYCODONE HCL 5 MG PO TABS: 5.0000 mg | ORAL_TABLET | Freq: Four times a day (QID) | ORAL | Status: DC | PRN

## 2014-07-22 MED ORDER — ONDANSETRON HCL 4 MG/2ML IJ SOLN: 4.0000 mg | Freq: Four times a day (QID) | INTRAMUSCULAR | Status: DC | PRN

## 2014-07-22 MED ORDER — MORPHINE SULFATE 2 MG/ML IJ SOLN
2.0000 mg | INTRAMUSCULAR | Status: DC | PRN
  Administered 2014-07-22 (×3): 2 mg via INTRAVENOUS
  Filled 2014-07-22 (×3): qty 1

## 2014-07-22 MED ORDER — HYDROMORPHONE HCL 1 MG/ML IJ SOLN: 1.0000 mg | INTRAMUSCULAR | Status: DC | PRN

## 2014-07-22 MED ORDER — SODIUM CHLORIDE 0.9 % IJ SOLN: 3.0000 mL | INTRAMUSCULAR | Status: DC | PRN

## 2014-07-22 MED ORDER — THIAMINE HCL 100 MG/ML IJ SOLN
100.0000 mg | Freq: Every day | INTRAMUSCULAR | Status: DC
  Administered 2014-07-22 – 2014-07-25 (×4): 100 mg via INTRAVENOUS
  Filled 2014-07-22: qty 1
  Filled 2014-07-22 (×2): qty 2
  Filled 2014-07-22: qty 1

## 2014-07-22 MED ORDER — PANTOPRAZOLE SODIUM 40 MG IV SOLR
40.0000 mg | Freq: Every day | INTRAVENOUS | Status: DC
  Administered 2014-07-22 – 2014-07-24 (×4): 40 mg via INTRAVENOUS
  Filled 2014-07-22 (×5): qty 40

## 2014-07-22 MED ORDER — THIAMINE HCL 100 MG/ML IJ SOLN
Freq: Once | INTRAVENOUS | Status: AC
  Administered 2014-07-22: 02:00:00 via INTRAVENOUS
  Filled 2014-07-22: qty 1000

## 2014-07-22 MED ORDER — ACETAMINOPHEN 650 MG RE SUPP: 650.0000 mg | Freq: Four times a day (QID) | RECTAL | Status: DC | PRN

## 2014-07-22 MED ORDER — CYCLOBENZAPRINE HCL 10 MG PO TABS: 10.0000 mg | ORAL_TABLET | Freq: Two times a day (BID) | ORAL | Status: DC | PRN

## 2014-07-22 MED ORDER — LORAZEPAM 2 MG/ML IJ SOLN
2.0000 mg | INTRAMUSCULAR | Status: DC | PRN
  Administered 2014-07-22: 3 mg via INTRAVENOUS
  Administered 2014-07-22: 2 mg via INTRAVENOUS
  Administered 2014-07-22 (×2): 3 mg via INTRAVENOUS
  Administered 2014-07-22: 2 mg via INTRAVENOUS
  Administered 2014-07-22: 3 mg via INTRAVENOUS
  Administered 2014-07-23 (×2): 2 mg via INTRAVENOUS
  Filled 2014-07-22 (×2): qty 2
  Filled 2014-07-22: qty 1
  Filled 2014-07-22: qty 2
  Filled 2014-07-22 (×2): qty 1

## 2014-07-22 MED ORDER — SODIUM CHLORIDE 0.9 % IV SOLN: 250.0000 mL | INTRAVENOUS | Status: DC | PRN

## 2014-07-22 NOTE — Progress Notes (Signed)
[facility] Progress Note Patient Name: Dale [name] DOB: 1955/12/22 MRN: [id number]   Date of Service  07/22/2014  HPI/Events of Note  Asked by [facility] for camera care.  Pt with active opiate and benzo withdrawal  eICU Interventions  Pt needs parenteral IV opiates in addition to IV ativan.  May yet start precedex drip     Intervention Category Major Interventions: Delirium, psychosis, severe agitation - evaluation and management  Asencion Noble 07/22/2014, 12:59 AM

## 2014-07-22 NOTE — Progress Notes (Signed)
Late entry, Pt TX from ED no family present. Escorted by [employer] and Security, no family present at this time. Pt alert to self, not following any commands, trying to get oob, pulling at tubing, appears to be very restless. Skin is dry and intact. Will continue to monitor. Jeanie Sewer, RN 6:00 AM 07/22/2014

## 2014-07-22 NOTE — Progress Notes (Addendum)
Patient ID: Dale [name], male   DOB: Jan 30, 1956, 58 y.o.   MRN: [id number] [facility] PROGRESS NOTE  Dale [name] NLZ:[id number] DOB: February 08, 1956 DOA: 07/21/2014 PCP: [name], MD  Brief narrative:    58 y.o. malewith history of back pain on multiple pain medications, anxiety and depression who presented to [facility] ED with opioid and benzodiazepine withdrawal. Per patient's family, patient has misplaced prescriptions and was not able to fill the medications. Patient was seen in ED one day prior to this admission for confusion thought to be secondary to opioid and benzodiazepine withdrawal. He was sent home with prescription for oxycodone. Patient subsequently returned to ED because of worsening confusion and delirium as well as hallucinations. Vitals signs on admission: BP 122/56, HR 108 - 128, T max 98.9 F and oxygen saturation 97% on room air. Urine drug screen was unremarkable and alcohol level was within normal limits. Patient was admitted to stepdown unit because of active hallucinations and delirium secondary to opioid and benzodiazepine withdrawal.   Assessment/Plan:    Principal problem: Acute drug-induced encephalopathy - Likely secondary to opioid and benzodiazepine withdrawal. Urine drug screen and alcohol level within normal limits on admission. - CT head on admission showed no acute intracranial findings  - Patient still confused with hallucinations. Sitter at bedside - continue CIWA protocol, watch for withdrawals. - will need psych evaluation once able to participate in conversation   Active Problems: Hypokalemia / hypomagnesemia - unclear etiology - we will supplement magnesium and potassium today - follow up BMP and magnesium level in am   Moderate protein calorie malnutrition - nutrition consulted   Pulmonary nodule - seen on CXR on admission in the left mid to upper lung. Recommend repeat dedicated PA and lateral chest x-ray when the patient is able to  hold breath. If the nodular opacity persists, chest CT may become warranted.     DVT Prophylaxis  - SCD's bilaterally   Code Status: Full.  Family Communication:  Family not at the bedside this am, sitter at the bedside  Disposition Plan: Home when stable.   IV access:   Peripheral IV  Procedures and diagnostic studies:    Ct Head Wo Contrast 07/21/2014    CT HEAD  1. No acute intracranial abnormality. CT CSPINE  1. No acute fracture or malalignment. 2. Multilevel cervical spondylosis with at least moderate narrowing of the central canal at C3-C4 and C6-C7 secondary to posterior disc osteophyte complexes. 3. Emphysematous change in the visualized upper lungs.     Ct Cervical Spine Wo Contrast 07/21/2014  CT HEAD  1. No acute intracranial abnormality. CT CSPINE  1. No acute fracture or malalignment. 2. Multilevel cervical spondylosis with at least moderate narrowing of the central canal at C3-C4 and C6-C7 secondary to posterior disc osteophyte complexes. 3. Emphysematous change in the visualized upper lungs.     Dg Chest Port 1 View 07/21/2014  1. No active cardiopulmonary process. 2. Possible 11 mm pulmonary nodule in the left mid to upper lung. Recommend repeat dedicated PA and lateral chest x-ray when the patient is able to breath hold. If the nodular opacity persists, chest CT may become warranted.     Medical Consultants:   None  Other Consultants:   None  IAnti-Infectives:    None    Leisa Lenz, MD  [facility] Pager [phone]  If 7PM-7AM, please contact night-coverage [email or web address] Password [facility] 07/22/2014, 7:50 AM   LOS: 1 day    HPI/Subjective: No acute overnight events.  Objective: Filed Vitals:   07/21/14 2330 07/21/14 2345 07/22/14 0000 07/22/14 0430  BP:   122/56 136/89  Pulse:   108   Temp:   98.6 F (37 C) 98.9 F (37.2 C)  TempSrc:   Oral Oral  Resp: 23 27 24    SpO2: 98% 97% 99%    No intake or output data in the 24 hours ending  07/22/14 0750  Exam:   General:  Pt is disoriented, has mittens on, restless   Cardiovascular: Regular rate and rhythm, S1/S2 appreciated   Respiratory: Clear to auscultation bilaterally, no wheezing, no crackles, no rhonchi  Abdomen: Soft, non tender, non distended, bowel sounds present  Extremities: No edema, pulses DP and PT palpable bilaterally  Neuro: Grossly nonfocal  Data Reviewed: Basic Metabolic Panel:  Recent Labs Lab 07/21/14 1702 07/21/14 2331 07/22/14 0155  NA 137  --  140  K 3.8  --  3.4*  CL 97  --  101  CO2 22  --  21  GLUCOSE 84  --  85  BUN 35*  --  31*  CREATININE 1.03  --  0.92  CALCIUM 10.1  --  9.5  MG  --  1.3* 1.4*  PHOS  --  2.2* 2.4   Liver Function Tests:  Recent Labs Lab 07/21/14 1702 07/22/14 0155  AST 137* 159*  ALT 43 45  ALKPHOS 76 65  BILITOT 0.9 1.0  PROT 8.4* 7.3  ALBUMIN 4.7 4.2   No results for input(s): LIPASE, AMYLASE in the last 168 hours. No results for input(s): AMMONIA in the last 168 hours. CBC:  Recent Labs Lab 07/21/14 1702 07/22/14 0155  WBC 9.7 8.9  NEUTROABS 6.6  --   HGB 14.1 13.2  HCT 41.1 38.6*  MCV 90.5 89.8  PLT 238 215   Cardiac Enzymes:  Recent Labs Lab 07/22/14 0155 07/22/14 0703  TROPONINI <0.30 <0.30   BNP: Invalid input(s): POCBNP CBG: No results for input(s): GLUCAP in the last 168 hours.  Recent Results (from the past 240 hour(s))  MRSA PCR Screening     Status: None   Collection Time: 07/22/14 12:55 AM  Result Value Ref Range Status   MRSA by PCR NEGATIVE NEGATIVE Final    Comment:           Scheduled Meds: . docusate sodium  100 mg Oral BID  . DULoxetine  60 mg Oral Daily  . folic acid  1 mg Intravenous Daily  . LORazepam  0-4 mg Intravenous 4 times per day  . LORazepam  0-4 mg Intravenous Q12H  . LORazepam  0-4 mg Oral 4 times per day  . LORazepam  0-4 mg Oral Q12H  . nicotine  21 mg Transdermal Daily  . pantoprazole (PROTONIX) IV  40 mg Intravenous QHS  .  thiamine  100 mg Intravenous Daily

## 2014-07-22 NOTE — Progress Notes (Signed)
  Echocardiogram 2D Echocardiogram has been performed.  Minus Breeding A 07/22/2014, 1:50 PM

## 2014-07-23 DIAGNOSIS — E440 Moderate protein-calorie malnutrition: Secondary | ICD-10-CM

## 2014-07-23 DIAGNOSIS — G9340 Encephalopathy, unspecified: Secondary | ICD-10-CM

## 2014-07-23 LAB — BASIC METABOLIC PANEL
ANION GAP: 11 (ref 5–15)
BUN: 21 mg/dL (ref 6–23)
CHLORIDE: 103 meq/L (ref 96–112)
CO2: 26 meq/L (ref 19–32)
Calcium: 8.7 mg/dL (ref 8.4–10.5)
Creatinine, Ser: 0.79 mg/dL (ref 0.50–1.35)
GFR calc Af Amer: >90 mL/min (ref >90)
GFR calc non Af Amer: >90 mL/min (ref >90)
Glucose, Bld: 93 mg/dL (ref 70–99)
Potassium: 3.8 mEq/L (ref 3.7–5.3)
SODIUM: 140 meq/L (ref 137–147)

## 2014-07-23 MED ORDER — HALOPERIDOL LACTATE 5 MG/ML IJ SOLN: 2.0000 mg | Freq: Four times a day (QID) | INTRAMUSCULAR | Status: DC | PRN

## 2014-07-23 MED ORDER — CETYLPYRIDINIUM CHLORIDE 0.05 % MT LIQD
7.0000 mL | Freq: Two times a day (BID) | OROMUCOSAL | Status: DC
  Administered 2014-07-23 – 2014-07-26 (×5): 7 mL via OROMUCOSAL

## 2014-07-23 NOTE — Progress Notes (Signed)
Report called to Erasmo Downer, Therapist, sports.  Transferring to room 1436 via hospital bed.

## 2014-07-23 NOTE — Plan of Care (Signed)
Problem: Phase I Progression Outcomes Goal: Pain controlled with appropriate interventions Outcome: Completed/Met Date Met:  07/23/14 Goal: OOB as tolerated unless otherwise ordered Outcome: Completed/Met Date Met:  07/23/14 Goal: Voiding-avoid urinary catheter unless indicated Outcome: Completed/Met Date Met:  07/23/14 Goal: Hemodynamically stable Outcome: Completed/Met Date Met:  07/23/14

## 2014-07-23 NOTE — Progress Notes (Signed)
Patient ID: Dale [name], male   DOB: [birth year], 58 y.o.   MRN: [id number] [facility] PROGRESS NOTE  Whalen Trompeter ENI:[id number] DOB: 1956/07/12 DOA: 07/21/2014 PCP: [name], MD  Brief narrative:    58 y.o. malewith history of back pain on multiple pain medications, anxiety and depression who presented to [facility] ED with opioid and benzodiazepine withdrawal. Per patient's family, patient has misplaced prescriptions and was not able to fill the medications. Patient was seen in ED one day prior to this admission for confusion thought to be secondary to opioid and benzodiazepine withdrawal. He was sent home with prescription for oxycodone. Patient subsequently returned to ED because of worsening confusion and delirium as well as hallucinations. Vitals signs on admission: BP 122/56, HR 108 - 128, T max 98.9 F and oxygen saturation 97% on room air. Urine drug screen was unremarkable and alcohol level was within normal limits. Patient was admitted to stepdown unit because of active hallucinations and delirium secondary to opioid and benzodiazepine withdrawal.   Assessment/Plan:    Principal problem: Acute drug-induced encephalopathy - Likely secondary to opioid and benzodiazepine withdrawal. Urine drug screen and alcohol level within normal limits on admission. - CT head on admission showed no acute intracranial findings  - Patient has received Ativan per CIWA protocol and now sedated; will continue to monitor mental status - will need psych evaluation once able to participate in conversation   Active Problems: Hypokalemia / hypomagnesemia - unclear etiology - Supplemented magnesium and potassium. WNL.  Moderate protein calorie malnutrition - nutrition consulted   Pulmonary nodule - seen on CXR on admission in the left mid to upper lung. Recommend repeat dedicated PA and lateral chest x-ray when the patient is able to hold breath. If the nodular opacity persists, chest CT may  become warranted.    DVT Prophylaxis  - SCD's bilaterally   Code Status: Full.  Family Communication: Family not at the bedside this am, sitter at the bedside  Disposition Plan: transfer to telemetry; order placed     IV access:   Peripheral IV  Procedures and diagnostic studies:    Ct Head Wo Contrast 07/21/2014 CT HEAD 1. No acute intracranial abnormality. CT CSPINE 1. No acute fracture or malalignment. 2. Multilevel cervical spondylosis with at least moderate narrowing of the central canal at C3-C4 and C6-C7 secondary to posterior disc osteophyte complexes. 3. Emphysematous change in the visualized upper lungs.   Ct Cervical Spine Wo Contrast 07/21/2014 CT HEAD 1. No acute intracranial abnormality. CT CSPINE 1. No acute fracture or malalignment. 2. Multilevel cervical spondylosis with at least moderate narrowing of the central canal at C3-C4 and C6-C7 secondary to posterior disc osteophyte complexes. 3. Emphysematous change in the visualized upper lungs.   Dg Chest Port 1 View 07/21/2014 1. No active cardiopulmonary process. 2. Possible 11 mm pulmonary nodule in the left mid to upper lung. Recommend repeat dedicated PA and lateral chest x-ray when the patient is able to breath hold. If the nodular opacity persists, chest CT may become warranted.   Medical Consultants:   None  Other Consultants:   None  IAnti-Infectives:    None    Leisa Lenz, MD  [facility] Pager [phone]  If 7PM-7AM, please contact night-coverage [email or web address] Password [email or web address] 07/23/2014, 8:16 AM   LOS: 2 days    HPI/Subjective: No acute overnight events.  Objective: Filed Vitals:   07/23/14 0215 07/23/14 0343 07/23/14 0447 07/23/14 0659  BP: 159/97  155/89 160/94  Pulse: 84  88  86  Temp:  97.8 F (36.6 C)    TempSrc:  Axillary    Resp: 21  25 21   SpO2: 98%  98% 97%    Intake/Output Summary (Last 24 hours) at 07/23/14 0816 Last data filed at 07/23/14 0700   Gross per 24 hour  Intake 711.67 ml  Output   1025 ml  Net -313.33 ml    Exam:   General:  Pt is not in acute distress  Cardiovascular: Regular rate and rhythm, S1/S2, no murmurs  Respiratory: no wheezing, no crackles, no rhonchi  Abdomen: Soft, non tender, non distended, bowel sounds present  Extremities: No edema, pulses DP and PT palpable bilaterally  Neuro: Grossly nonfocal  Data Reviewed: Basic Metabolic Panel:  Recent Labs Lab 07/21/14 1702 07/21/14 2331 07/22/14 0155 07/23/14 0400  NA 137  --  140 140  K 3.8  --  3.4* 3.8  CL 97  --  101 103  CO2 22  --  21 26  GLUCOSE 84  --  85 93  BUN 35*  --  31* 21  CREATININE 1.03  --  0.92 0.79  CALCIUM 10.1  --  9.5 8.7  MG  --  1.3* 1.4*  --   PHOS  --  2.2* 2.4  --    Liver Function Tests:  Recent Labs Lab 07/21/14 1702 07/22/14 0155  AST 137* 159*  ALT 43 45  ALKPHOS 76 65  BILITOT 0.9 1.0  PROT 8.4* 7.3  ALBUMIN 4.7 4.2   No results for input(s): LIPASE, AMYLASE in the last 168 hours. No results for input(s): AMMONIA in the last 168 hours. CBC:  Recent Labs Lab 07/21/14 1702 07/22/14 0155  WBC 9.7 8.9  NEUTROABS 6.6  --   HGB 14.1 13.2  HCT 41.1 38.6*  MCV 90.5 89.8  PLT 238 215   Cardiac Enzymes:  Recent Labs Lab 07/22/14 0155 07/22/14 0703 07/22/14 1410  TROPONINI <0.30 <0.30 <0.30   BNP: Invalid input(s): POCBNP CBG: No results for input(s): GLUCAP in the last 168 hours.  Recent Results (from the past 240 hour(s))  MRSA PCR Screening     Status: None   Collection Time: 07/22/14 12:55 AM  Result Value Ref Range Status   MRSA by PCR NEGATIVE NEGATIVE Final    Comment:            Scheduled Meds: . DULoxetine  60 mg Oral Daily  . folic acid  1 mg Intravenous Daily  . LORazepam  0-4 mg Intravenous 4 times per day  . LORazepam  0-4 mg Intravenous Q12H  . LORazepam  0-4 mg Oral 4 times per day  . LORazepam  0-4 mg Oral Q12H  . nicotine  21 mg Transdermal Daily  .  pantoprazole (PROTONIX) IV  40 mg Intravenous QHS  . thiamine  100 mg Intravenous Daily   Continuous Infusions: . sodium chloride 50 mL/hr at 07/22/14 1845

## 2014-07-24 DIAGNOSIS — F329 Major depressive disorder, single episode, unspecified: Secondary | ICD-10-CM

## 2014-07-24 DIAGNOSIS — F411 Generalized anxiety disorder: Secondary | ICD-10-CM

## 2014-07-24 LAB — BASIC METABOLIC PANEL
Anion gap: 15 (ref 5–15)
BUN: 20 mg/dL (ref 6–23)
CALCIUM: 9.1 mg/dL (ref 8.4–10.5)
CO2: 23 mEq/L (ref 19–32)
Chloride: 103 mEq/L (ref 96–112)
Creatinine, Ser: 0.76 mg/dL (ref 0.50–1.35)
GLUCOSE: 150 mg/dL — AB (ref 70–99)
POTASSIUM: 3.5 meq/L — AB (ref 3.7–5.3)
Sodium: 141 mEq/L (ref 137–147)

## 2014-07-24 LAB — HCV RNA QUANT
HCV QUANT LOG: 5.35 {Log} — AB (ref <1.18)
HCV Quantitative: 221718 IU/mL — ABNORMAL HIGH (ref <15)

## 2014-07-24 MED ORDER — POTASSIUM CHLORIDE CRYS ER 20 MEQ PO TBCR
40.0000 meq | EXTENDED_RELEASE_TABLET | Freq: Once | ORAL | Status: AC
  Administered 2014-07-24: 40 meq via ORAL
  Filled 2014-07-24: qty 2

## 2014-07-24 NOTE — Progress Notes (Signed)
Pt roaming in hallway and instructed to return to his room. Pt not willing to remain in his room and stating that he was going to leave the hospital. Security called.  Pt informed that he has not been discharged and the doctor will see him tomorrow to discuss discharge.

## 2014-07-24 NOTE — Evaluation (Signed)
Physical Therapy One Time Evaluation Patient Details Name: Dale [name] MRN: [id number] DOB: 1956-01-09 Today's Date: 07/24/2014   History of Present Illness  58 y.o. male with history of back pain on multiple pain medications, anxiety and depression admitted for acute drug-induced encephalopathy likely due to opioid and benzodiazepine withdrawal.  Clinical Impression  Patient evaluated by Physical Therapy with no further acute PT needs identified. All education has been completed and the patient has no further questions.  Pt reports hx of falls and states most caused by back pain, LE buckling.  Pt reports back injury several years ago and states he has poor core strength and decreased balance.  Pt very agreeable to f/u physical therapy.  Pt would likely best benefit from outpatient PT however if this is not possible then HHPT.   Pt declines any assistive device at this time, states he wouldn't use one even if provided. Pt reports someone is available to assist at home if needed (did not specify). PT is signing off. Thank you for this referral.       Follow Up Recommendations Home health PT;Outpatient PT    Equipment Recommendations  Cane (pt declines any assistive device at this time)    Recommendations for Other Services       Precautions / Restrictions Precautions Precautions: Fall      Mobility  Bed Mobility Overal bed mobility: Modified Independent                Transfers Overall transfer level: Needs assistance Equipment used: None Transfers: Sit to/from Stand Sit to Stand: Supervision            Ambulation/Gait Ambulation/Gait assistance: Min guard Ambulation Distance (Feet): 400 Feet Assistive device: None Gait Pattern/deviations: Step-through pattern     General Gait Details: occasional unsteadiness, one instance of holding railing to self correct, pt declines use of assistive device and states he would not use if provided for one at home  Stairs             Wheelchair Mobility    Modified Rankin (Stroke Patients Only)       Balance Overall balance assessment: History of Falls ("too many to count" in past year)                                           Pertinent Vitals/Pain Pain Assessment: No/denies pain    Home Living Family/patient expects to be discharged to:: Private residence Living Arrangements: Alone   Type of Home: House Home Access: Level entry     Home Layout: One level Home Equipment: None      Prior Function Level of Independence: Independent               Hand Dominance        Extremity/Trunk Assessment               Lower Extremity Assessment: Overall WFL for tasks assessed         Communication   Communication: No difficulties  Cognition Arousal/Alertness: Awake/alert Behavior During Therapy: WFL for tasks assessed/performed Overall Cognitive Status: Within Functional Limits for tasks assessed                      General Comments      Exercises        Assessment/Plan    PT Assessment Patient needs continued  PT services  PT Diagnosis Difficulty walking (decreased balance)   PT Problem List Decreased strength;Decreased balance;Decreased mobility  PT Treatment Interventions     PT Goals (Current goals can be found in the Care Plan section) Acute Rehab PT Goals PT Goal Formulation: All assessment and education complete, DC therapy    Frequency     Barriers to discharge        Co-evaluation               End of Session   Activity Tolerance: Patient tolerated treatment well Patient left: in bed;with call bell/phone within reach;with bed alarm set           Time: 1610-9604 PT Time Calculation (min) (ACUTE ONLY): 11 min   Charges:   PT Evaluation $Initial PT Evaluation Tier I: 1 Procedure PT Treatments $Gait Training: 8-22 mins   PT G Codes:          Hayla Hinger,KATHrine E 07/24/2014, 2:30 PM Carmelia Bake, PT,  DPT 07/24/2014 Pager: [phone]

## 2014-07-24 NOTE — Progress Notes (Signed)
Patient ID: Dale [name], male   DOB: June 14, 1956, 58 y.o.   MRN: [id number] [facility] PROGRESS NOTE  Dale [name] TSV:[id number] DOB: Sep 05, 1955 DOA: 07/21/2014 PCP: Dale Chessman, MD  Brief narrative:    58 y.o. malewith history of back pain on multiple pain medications, anxiety and depression who presented to [facility] ED with opioid and benzodiazepine withdrawal. Per patient's family, patient has misplaced prescriptions and was not able to fill the medications. Patient was seen in ED one day prior to this admission for confusion thought to be secondary to opioid and benzodiazepine withdrawal. He was sent home with prescription for oxycodone. Patient subsequently returned to ED because of worsening confusion and delirium as well as hallucinations. Vitals signs on admission were stable. Urine drug screen was unremarkable and alcohol level was within normal limits. Patient was admitted to stepdown unit because of active hallucinations and delirium secondary to opioid and benzodiazepine withdrawal.  Of note, patient's son Dale [name] apparently very upset that he was not updated on his father's condition. Family was never present at the time of rounding. Rn never informed this MD of family asking for updates until today. This MD had a conversation with patient's son for 45 minutes who requested psych MD to be present when they evaluate Dale [name]. Please note, son was asked at the time of admission in ED to leave the premises as he recorded with video evaluation of his father by RN and staff. He refused to abide by rule as he was notified recording is hippa violation.   Assessment/Plan:    Principal problem: Acute drug-induced encephalopathy - Likely secondary to opioid and benzodiazepine withdrawal. Urine drug screen and alcohol level within normal limits on admission. - CT head on admission showed no acute intracranial findings  - Patient was on CIWA protocol - transferred to telemtry floor  07/23/2014. Mental status better but per psych pt has no capacity  - will SW for assistance in regards to D/C planning to SNF - palliative care consulted for pain management, pt with long standing h/o narcotic abuse, potential addition versus tolerance for extensive back pain issue  Active Problems: Hypokalemia / hypomagnesemia - unclear etiology - Supplemented magnesium and potassium. WNL.  Moderate protein calorie malnutrition - nutrition consulted   Pulmonary nodule - seen on CXR on admission in the left mid to upper lung. Recommend repeat dedicated PA and lateral chest x-ray when the patient is able to hold breath. If the nodular opacity persists, chest CT may become warranted.    DVT Prophylaxis  - SCD's bilaterally   Code Status: Full.  Family Communication: Family not at the bedside this am, sitter at the bedside  Disposition Plan: transfer to telemetry; order placed    IV access:   Peripheral IV  Procedures and diagnostic studies:    Ct Head Wo Contrast 07/21/2014 CT HEAD 1. No acute intracranial abnormality. CT CSPINE 1. No acute fracture or malalignment. 2. Multilevel cervical spondylosis with at least moderate narrowing of the central canal at C3-C4 and C6-C7 secondary to posterior disc osteophyte complexes. 3. Emphysematous change in the visualized upper lungs.   Ct Cervical Spine Wo Contrast 07/21/2014 CT HEAD 1. No acute intracranial abnormality. CT CSPINE 1. No acute fracture or malalignment. 2. Multilevel cervical spondylosis with at least moderate narrowing of the central canal at C3-C4 and C6-C7 secondary to posterior disc osteophyte complexes. 3. Emphysematous change in the visualized upper lungs.   Dg Chest Port 1 View 07/21/2014 1. No active cardiopulmonary process. 2.  Possible 11 mm pulmonary nodule in the left mid to upper lung. Recommend repeat dedicated PA and lateral chest x-ray when the patient is able to breath hold. If the nodular  opacity persists, chest CT may become warranted.   Medical Consultants:   Psychiatry   Palliative care for pain management  Other Consultants:   Physical therapy  Social work  IAnti-Infectives:    None    Leisa Lenz, MD  [facility] Pager [phone]  If 7PM-7AM, please contact night-coverage [email or web address] Password [email or web address] 07/24/2014, 5:08 PM   LOS: 3 days    HPI/Subjective: No acute overnight events.  Objective: Filed Vitals:   07/23/14 1010 07/23/14 1400 07/23/14 2102 07/24/14 1412  BP: 150/89 143/90 126/79 151/96  Pulse: 90 86 102 97  Temp: 98.7 F (37.1 C) 98.8 F (37.1 C) 98.2 F (36.8 C) 97.6 F (36.4 C)  TempSrc:  Oral Oral Oral  Resp: 24 20 20 18   SpO2: 98% 99% 100% 100%    Intake/Output Summary (Last 24 hours) at 07/24/14 1708 Last data filed at 07/24/14 1412  Gross per 24 hour  Intake    960 ml  Output      0 ml  Net    960 ml    Exam:   General:  Pt is alert, follows commands appropriately, not in acute distress  Cardiovascular: Regular rate and rhythm, S1/S2, no murmurs  Respiratory: Clear to auscultation bilaterally, no wheezing, no crackles, no rhonchi  Abdomen: Soft, non tender, non distended, bowel sounds present  Extremities: No edema, pulses DP and PT palpable bilaterally  Neuro: Grossly nonfocal  Data Reviewed: Basic Metabolic Panel:  Recent Labs Lab 07/21/14 1702 07/21/14 2331 07/22/14 0155 07/23/14 0400 07/24/14 0459  NA 137  --  140 140 141  K 3.8  --  3.4* 3.8 3.5*  CL 97  --  101 103 103  CO2 22  --  21 26 23   GLUCOSE 84  --  85 93 150*  BUN 35*  --  31* 21 20  CREATININE 1.03  --  0.92 0.79 0.76  CALCIUM 10.1  --  9.5 8.7 9.1  MG  --  1.3* 1.4*  --   --   PHOS  --  2.2* 2.4  --   --    Liver Function Tests:  Recent Labs Lab 07/21/14 1702 07/22/14 0155  AST 137* 159*  ALT 43 45  ALKPHOS 76 65  BILITOT 0.9 1.0  PROT 8.4* 7.3  ALBUMIN 4.7 4.2   No results for input(s): LIPASE, AMYLASE in  the last 168 hours. No results for input(s): AMMONIA in the last 168 hours. CBC:  Recent Labs Lab 07/21/14 1702 07/22/14 0155  WBC 9.7 8.9  NEUTROABS 6.6  --   HGB 14.1 13.2  HCT 41.1 38.6*  MCV 90.5 89.8  PLT 238 215   Cardiac Enzymes:  Recent Labs Lab 07/22/14 0155 07/22/14 0703 07/22/14 1410  TROPONINI <0.30 <0.30 <0.30   BNP: Invalid input(s): POCBNP CBG: No results for input(s): GLUCAP in the last 168 hours.  Recent Results (from the past 240 hour(s))  MRSA PCR Screening     Status: None   Collection Time: 07/22/14 12:55 AM  Result Value Ref Range Status   MRSA by PCR NEGATIVE NEGATIVE Final    Comment:        The GeneXpert MRSA Assay (FDA approved for NASAL specimens only), is one component of a comprehensive MRSA colonization surveillance program. It is not intended to  diagnose MRSA infection nor to guide or monitor treatment for MRSA infections.      Scheduled Meds: . antiseptic oral rinse  7 mL Mouth Rinse BID  . DULoxetine  60 mg Oral Daily  . folic acid  1 mg Intravenous Daily  . LORazepam  0-4 mg Oral Q12H  . nicotine  21 mg Transdermal Daily  . pantoprazole (PROTONIX) IV  40 mg Intravenous QHS  . potassium chloride  40 mEq Oral Once  . sodium chloride  3 mL Intravenous Q12H  . thiamine  100 mg Intravenous Daily   Continuous Infusions:

## 2014-07-24 NOTE — Plan of Care (Signed)
Problem: Phase I Progression Outcomes Goal: Initial discharge plan identified Outcome: Completed/Met Date Met:  07/24/14  Problem: Phase II Progression Outcomes Goal: Progress activity as tolerated unless otherwise ordered Outcome: Completed/Met Date Met:  07/24/14  Problem: Phase III Progression Outcomes Goal: Voiding independently Outcome: Completed/Met Date Met:  07/24/14

## 2014-07-24 NOTE — Progress Notes (Signed)
INITIAL NUTRITION ASSESSMENT  DOCUMENTATION CODES Per approved criteria  -Non-severe (moderate) malnutrition in the context of chronic illness  Pt meets criteria for moderate MALNUTRITION in the context of chronic illness as evidenced by PO intake < 75% > one month, moderate muscle wasting and subcutaneous fat loss.   INTERVENTION: -Recommend updated weight  -Encouraged general healthy nutrition for recovery -Encouraged use of nutrition supplements as warranted; will order Ensure Complete PRN as diet advancement tolerated  -Continue MVI -RD to continue to monitor  NUTRITION DIAGNOSIS: Inadequate oral intake related to substance abuse/withdrawal as evidenced by pt report.   Goal: Pt to meet >/= 90% of their estimated nutrition needs    Monitor:  Diet order, Total protein/energy intake, labs, weights, diet education needs  Reason for Assessment: Consult  58 y.o. male  Admitting Dx: Acute encephalopathy  ASSESSMENT: Patient is unable to provide any of his own history. He was brought in by his son due to opioid and benzodiazepine withdrawal  -Pt denied any unintentional wt loss pta; however, pt unable to quantify a normal weight. Recommend obtaining new weight as current wt is > one year old -Diet recall indicates pt consuming one large meal daily. Encouraged increased intake of balanced meals for overall recovery and general health.  -Pt denied use of nutrition supplements; however pt's family member drinks them regularly and pt was willing to consume them as tolerated. Declined Ensure or Boost at this time -Currently on clear liquid diet. Pt tolerating 50-100% without nausea/vomiting -Pt with moderate muscle wasting and subcutaneous fat loss in temporal, clavicle, and upper arm regions  Height: Ht Readings from Last 1 Encounters:  03/08/13 6\' 3"  (1.905 m)    Weight: Wt Readings from Last 1 Encounters:  03/09/13 178 lb 9.2 oz (81 kg)    Ideal Body Weight: 196 lb  %  Ideal Body Weight: 91%  Wt Readings from Last 10 Encounters:  03/09/13 178 lb 9.2 oz (81 kg)  01/31/13 192 lb (87.091 kg)  05/02/12 285 lb (129.275 kg)    Usual Body Weight: pt unsure at this time  % Usual Body Weight: pt unsure at this time  BMI:  There is no weight on file to calculate BMI.  Estimated Nutritional Needs: Kcal: 2100-2300 Protein: 90-100 gram Fluid: >/= 2100 ml daily  Skin: WDL  Diet Order: Diet clear liquid  EDUCATION NEEDS: -Education needs addressed   Intake/Output Summary (Last 24 hours) at 07/24/14 1454 Last data filed at 07/24/14 1412  Gross per 24 hour  Intake   1550 ml  Output      0 ml  Net   1550 ml    Last BM: WDL   Labs:   Recent Labs Lab 07/21/14 2331 07/22/14 0155 07/23/14 0400 07/24/14 0459  NA  --  140 140 141  K  --  3.4* 3.8 3.5*  CL  --  101 103 103  CO2  --  21 26 23   BUN  --  31* 21 20  CREATININE  --  0.92 0.79 0.76  CALCIUM  --  9.5 8.7 9.1  MG 1.3* 1.4*  --   --   PHOS 2.2* 2.4  --   --   GLUCOSE  --  85 93 150*    CBG (last 3)  No results for input(s): GLUCAP in the last 72 hours.  Scheduled Meds: . antiseptic oral rinse  7 mL Mouth Rinse BID  . DULoxetine  60 mg Oral Daily  . folic acid  1 mg Intravenous Daily  . LORazepam  0-4 mg Oral Q12H  . nicotine  21 mg Transdermal Daily  . pantoprazole (PROTONIX) IV  40 mg Intravenous QHS  . potassium chloride  40 mEq Oral Once  . sodium chloride  3 mL Intravenous Q12H  . thiamine  100 mg Intravenous Daily    Continuous Infusions:   Past Medical History  Diagnosis Date  . Back pain   . Acute renal failure 03/08/2013    Past Surgical History  Procedure Laterality Date  . Leg surgery      left femur fx    Atlee Abide MS RD LDN Clinical Dietitian [phone]

## 2014-07-24 NOTE — Consult Note (Signed)
Psychiatry Consult Follow Up Note  Reason for Consult:  AMS, capacity evaluation Referring Physician:  Dr. Bernette Mayers Dale [name] is an 58 y.o. male. Total Time spent with patient: 30 minutes  Assessment: AXIS I:  Generalized Anxiety Disorder and Major Depression, Recurrent severe AXIS II:  Deferred AXIS III:   Past Medical History  Diagnosis Date  . Back pain   . Acute renal failure 03/08/2013   AXIS IV:  other psychosocial or environmental problems, problems related to social environment and problems with primary support group AXIS V:  51-60 moderate symptoms  Plan:  Continue current medication management and monitor for withdrawal symptoms and AMS Patient has No capacity to make his own medical decisions and living arrangement No evidence of imminent risk to self or others at present.   Patient does not meet criteria for psychiatric inpatient admission. Supportive therapy provided about ongoing stressors.  Appreciate psychiatric consultation and follow up as clinically required Please contact [phone] or [phone] if needs further assistance  Subjective:   Dale [name] is a 58 y.o. male patient admitted with AMS and opioid and benzo's withdrawal.  HPI:  Dale [name] is a 58 y.o. male seen and chart reviewed. Patient stated that he has been suffering with depression, anxiety and chronic back pain and Acute renal failure (03/08/2013). Patient stated that he has been compliant with his medications and denied having withdrawal symptoms of his xanax and opioids. Patient apparently takes Xanax and opana at home would by pain clinic. Patient is calm, awake, alert and oriented well. He has poor orientation, concentration, memory 2/3 and avarage language function and fund of knowledge. Patient has inadequate knowledge about his medical and mental health and substance abuse problems and needed treatments. Patient could not tell me whole details of what happened when he came to emergency department  and denied current symptoms of depression and psychosis. Patient denied being involved with mental health treatment. Patient has been living in his sister's home and has been disabled. He has two children and showed to picture of his two grandchildren. He denied suicide or homicide ideation, intention or plans. Spoke with patient sister who stated that he was diagnosed with bipolar disorder but non compliant with his medications and sees Dr. Reece Levy. She also states that he is non compliant with his pain medications and possible abuse.   Interval History: Spoke with patient and his son who is at bed side along with LCSW. Patient stated that he has been suffering with bipolar depression and anxiety and chronic pain syndrome and has stopped taking his pain medication and xanax about 4-6 days ago because he is looking for not take medication which is not helping him to function. He has withdrawal delirium with hallucinations on arrival which was slowly improved. He was taken cymbalta, valium and xanax from Dr. Reece Levy over 6-7 years and has one previous psych admission at [facility] and given a trial of seroquel but stopped with two weeks due to dystonic reaction. Patient will be seen by palliative care for possible better treatment for chronic pain syndrome without pain medications.   Medical history: Patient returned to emergency department today with worsening confusion and delirium with hallucinating. Patient was being evaluated when he became non-redirectable was witnessed hitting his head and wandering the halls. At that point in emergency department had IVC patient and [employer] was called. Patient was restrained. Apparently prior to this patient's son was asked to leave the premises after repeatedly attempting to video-tape which is  against HIPPA regulations. Apparently patient has history of psychiatric illness which does become exacerbated after withdrawing from chronic medications   HPI Elements:  Location:   depression and chronic pain. Quality:  fair to poor. Severity:  denied suicide or homicide idations. Timing:  unknown psychosocial stresses.  Past Psychiatric History: Past Medical History  Diagnosis Date  . Back pain   . Acute renal failure 03/08/2013    reports that he has been smoking Cigarettes.  He has a 30 pack-year smoking history. He has never used smokeless tobacco. He reports that he drinks alcohol. He reports that he does not use illicit drugs. Family History  Problem Relation Age of Onset  . Family history unknown: Yes           Allergies:   Allergies  Allergen Reactions  . Penicillins Hives    ACT Assessment Complete:  NO Objective: Blood pressure 126/79, pulse 102, temperature 98.2 F (36.8 C), temperature source Oral, resp. rate 20, SpO2 100 %.There is no weight on file to calculate BMI. Results for orders placed or performed during the hospital encounter of 07/21/14 (from the past 72 hour(s))  CBC WITH DIFFERENTIAL     Status: Abnormal   Collection Time: 07/21/14  5:02 PM  Result Value Ref Range   WBC 9.7 4.0 - 10.5 K/uL   RBC 4.54 4.22 - 5.81 MIL/uL   Hemoglobin 14.1 13.0 - 17.0 g/dL   HCT 41.1 39.0 - 52.0 %   MCV 90.5 78.0 - 100.0 fL   MCH 31.1 26.0 - 34.0 pg   MCHC 34.3 30.0 - 36.0 g/dL   RDW 13.5 11.5 - 15.5 %   Platelets 238 150 - 400 K/uL   Neutrophils Relative % 68 43 - 77 %   Neutro Abs 6.6 1.7 - 7.7 K/uL   Lymphocytes Relative 19 12 - 46 %   Lymphs Abs 1.8 0.7 - 4.0 K/uL   Monocytes Relative 13 (H) 3 - 12 %   Monocytes Absolute 1.3 (H) 0.1 - 1.0 K/uL   Eosinophils Relative 0 0 - 5 %   Eosinophils Absolute 0.0 0.0 - 0.7 K/uL   Basophils Relative 0 0 - 1 %   Basophils Absolute 0.0 0.0 - 0.1 K/uL  Comprehensive metabolic panel     Status: Abnormal   Collection Time: 07/21/14  5:02 PM  Result Value Ref Range   Sodium 137 137 - 147 mEq/L   Potassium 3.8 3.7 - 5.3 mEq/L   Chloride 97 96 - 112 mEq/L   CO2 22 19 - 32 mEq/L   Glucose, Bld 84  70 - 99 mg/dL   BUN 35 (H) 6 - 23 mg/dL   Creatinine, Ser 1.03 0.50 - 1.35 mg/dL   Calcium 10.1 8.4 - 10.5 mg/dL   Total Protein 8.4 (H) 6.0 - 8.3 g/dL   Albumin 4.7 3.5 - 5.2 g/dL   AST 137 (H) 0 - 37 U/L   ALT 43 0 - 53 U/L   Alkaline Phosphatase 76 39 - 117 U/L   Total Bilirubin 0.9 0.3 - 1.2 mg/dL   GFR calc non Af Amer 78 (L) >90 mL/min   GFR calc Af Amer >90 >90 mL/min    Comment: (NOTE) The eGFR has been calculated using the CKD EPI equation. This calculation has not been validated in all clinical situations. eGFR's persistently <90 mL/min signify possible Chronic Kidney Disease.    Anion gap 18 (H) 5 - 15  Ethanol     Status: None  Collection Time: 07/21/14  5:02 PM  Result Value Ref Range   Alcohol, Ethyl (B) <11 0 - 11 mg/dL    Comment:        LOWEST DETECTABLE LIMIT FOR SERUM ALCOHOL IS 11 mg/dL FOR MEDICAL PURPOSES ONLY   Magnesium     Status: Abnormal   Collection Time: 07/21/14 11:31 PM  Result Value Ref Range   Magnesium 1.3 (L) 1.5 - 2.5 mg/dL  Phosphorus     Status: Abnormal   Collection Time: 07/21/14 11:31 PM  Result Value Ref Range   Phosphorus 2.2 (L) 2.3 - 4.6 mg/dL  Lactic acid, plasma     Status: None   Collection Time: 07/21/14 11:31 PM  Result Value Ref Range   Lactic Acid, Venous 0.9 0.5 - 2.2 mmol/L  MRSA PCR Screening     Status: None   Collection Time: 07/22/14 12:55 AM  Result Value Ref Range   MRSA by PCR NEGATIVE NEGATIVE    Comment:        The GeneXpert MRSA Assay (FDA approved for NASAL specimens only), is one component of a comprehensive MRSA colonization surveillance program. It is not intended to diagnose MRSA infection nor to guide or monitor treatment for MRSA infections.   Magnesium     Status: Abnormal   Collection Time: 07/22/14  1:55 AM  Result Value Ref Range   Magnesium 1.4 (L) 1.5 - 2.5 mg/dL  Phosphorus     Status: None   Collection Time: 07/22/14  1:55 AM  Result Value Ref Range   Phosphorus 2.4 2.3 - 4.6  mg/dL  TSH     Status: None   Collection Time: 07/22/14  1:55 AM  Result Value Ref Range   TSH 1.800 0.350 - 4.500 uIU/mL    Comment: Performed at [facility]  Comprehensive metabolic panel     Status: Abnormal   Collection Time: 07/22/14  1:55 AM  Result Value Ref Range   Sodium 140 137 - 147 mEq/L   Potassium 3.4 (L) 3.7 - 5.3 mEq/L   Chloride 101 96 - 112 mEq/L   CO2 21 19 - 32 mEq/L   Glucose, Bld 85 70 - 99 mg/dL   BUN 31 (H) 6 - 23 mg/dL   Creatinine, Ser 0.92 0.50 - 1.35 mg/dL   Calcium 9.5 8.4 - 10.5 mg/dL   Total Protein 7.3 6.0 - 8.3 g/dL   Albumin 4.2 3.5 - 5.2 g/dL   AST 159 (H) 0 - 37 U/L   ALT 45 0 - 53 U/L   Alkaline Phosphatase 65 39 - 117 U/L   Total Bilirubin 1.0 0.3 - 1.2 mg/dL   GFR calc non Af Amer >90 >90 mL/min   GFR calc Af Amer >90 >90 mL/min    Comment: (NOTE) The eGFR has been calculated using the CKD EPI equation. This calculation has not been validated in all clinical situations. eGFR's persistently <90 mL/min signify possible Chronic Kidney Disease.    Anion gap 18 (H) 5 - 15  CBC     Status: Abnormal   Collection Time: 07/22/14  1:55 AM  Result Value Ref Range   WBC 8.9 4.0 - 10.5 K/uL   RBC 4.30 4.22 - 5.81 MIL/uL   Hemoglobin 13.2 13.0 - 17.0 g/dL   HCT 38.6 (L) 39.0 - 52.0 %   MCV 89.8 78.0 - 100.0 fL   MCH 30.7 26.0 - 34.0 pg   MCHC 34.2 30.0 - 36.0 g/dL   RDW 13.4  11.5 - 15.5 %   Platelets 215 150 - 400 K/uL  Troponin I     Status: None   Collection Time: 07/22/14  1:55 AM  Result Value Ref Range   Troponin I <0.30 <0.30 ng/mL    Comment:        Due to the release kinetics of cTnI, a negative result within the first hours of the onset of symptoms does not rule out myocardial infarction with certainty. If myocardial infarction is still suspected, repeat the test at appropriate intervals.   Hemoglobin A1c     Status: Abnormal   Collection Time: 07/22/14  1:55 AM  Result Value Ref Range   Hgb A1c MFr Bld 5.9 (H) <5.7  %    Comment: (NOTE)                                                                       According to the ADA Clinical Practice Recommendations for 2011, when HbA1c is used as a screening test:  >=6.5%   Diagnostic of Diabetes Mellitus           (if abnormal result is confirmed) 5.7-6.4%   Increased risk of developing Diabetes Mellitus References:Diagnosis and Classification of Diabetes Mellitus,Diabetes UJWJ,1914,78(GNFAO 1):S62-S69 and Standards of Medical Care in         Diabetes - 2011,Diabetes ZHYQ,6578,46 (Suppl 1):S11-S61.    Mean Plasma Glucose 123 (H) <117 mg/dL    Comment: Performed at [facility]  HIV antibody     Status: None   Collection Time: 07/22/14  1:55 AM  Result Value Ref Range   HIV 1&2 Ab, 4th Generation NONREACTIVE NONREACTIVE    Comment: (NOTE) A NONREACTIVE HIV Ag/Ab result does not exclude HIV infection since the time frame for seroconversion is variable. If acute HIV infection is suspected, a HIV-1 RNA Qualitative TMA test is recommended. HIV-1/2 Antibody Diff         Not indicated. HIV-1 RNA, Qual TMA           Not indicated. PLEASE NOTE: This information has been disclosed to you from records whose confidentiality may be protected by state law. If your state requires such protection, then the state law prohibits you from making any further disclosure of the information without the specific written consent of the person to whom it pertains, or as otherwise permitted by law. A general authorization for the release of medical or other information is NOT sufficient for this purpose. The performance of this assay has not been clinically validated in patients less than 27 years old. Performed at [facility]   Hepatitis panel, acute     Status: Abnormal   Collection Time: 07/22/14  1:55 AM  Result Value Ref Range   Hepatitis B Surface Ag NEGATIVE NEGATIVE   HCV Ab Reactive (A) NEGATIVE   Hep A IgM NON REACTIVE NON REACTIVE    Comment:  (NOTE) Effective July 10, 2014, Hepatitis Acute Panel (test code [phone]) will be revised to automatically reflex to the Hepatitis C Viral RNA, Quantitative, Real-Time PCR assay if the Hepatitis C antibody screening result is Reactive. This action is being taken to ensure that the CDC/USPSTF recommended HCV diagnostic algorithm with the appropriate test reflex needed for accurate  interpretation is followed.    Hep B C IgM NON REACTIVE NON REACTIVE    Comment: (NOTE) High levels of Hepatitis B Core IgM antibody are detectable during the acute stage of Hepatitis B. This antibody is used to differentiate current from past HBV infection. Performed at [facility]   Troponin I     Status: None   Collection Time: 07/22/14  7:03 AM  Result Value Ref Range   Troponin I <0.30 <0.30 ng/mL    Comment:        Due to the release kinetics of cTnI, a negative result within the first hours of the onset of symptoms does not rule out myocardial infarction with certainty. If myocardial infarction is still suspected, repeat the test at appropriate intervals.   Drug screen panel, emergency     Status: None   Collection Time: 07/22/14  7:53 AM  Result Value Ref Range   Opiates NONE DETECTED NONE DETECTED   Cocaine NONE DETECTED NONE DETECTED   Benzodiazepines NONE DETECTED NONE DETECTED   Amphetamines NONE DETECTED NONE DETECTED   Tetrahydrocannabinol NONE DETECTED NONE DETECTED   Barbiturates NONE DETECTED NONE DETECTED    Comment:        DRUG SCREEN FOR MEDICAL PURPOSES ONLY.  IF CONFIRMATION IS NEEDED FOR ANY PURPOSE, NOTIFY LAB WITHIN 5 DAYS.        LOWEST DETECTABLE LIMITS FOR URINE DRUG SCREEN Drug Class       Cutoff (ng/mL) Amphetamine      1000 Barbiturate      200 Benzodiazepine   947 Tricyclics       096 Opiates          300 Cocaine          300 THC              50   Urinalysis, Routine w reflex microscopic     Status: Abnormal   Collection Time: 07/22/14  7:53 AM   Result Value Ref Range   Color, Urine AMBER (A) YELLOW    Comment: BIOCHEMICALS MAY BE AFFECTED BY COLOR   APPearance CLEAR CLEAR   Specific Gravity, Urine 1.028 1.005 - 1.030   pH 6.0 5.0 - 8.0   Glucose, UA NEGATIVE NEGATIVE mg/dL   Hgb urine dipstick NEGATIVE NEGATIVE   Bilirubin Urine SMALL (A) NEGATIVE   Ketones, ur 40 (A) NEGATIVE mg/dL   Protein, ur NEGATIVE NEGATIVE mg/dL   Urobilinogen, UA 1.0 0.0 - 1.0 mg/dL   Nitrite NEGATIVE NEGATIVE   Leukocytes, UA NEGATIVE NEGATIVE    Comment: MICROSCOPIC NOT DONE ON URINES WITH NEGATIVE PROTEIN, BLOOD, LEUKOCYTES, NITRITE, OR GLUCOSE <1000 mg/dL.  Troponin I     Status: None   Collection Time: 07/22/14  2:10 PM  Result Value Ref Range   Troponin I <0.30 <0.30 ng/mL    Comment:        Due to the release kinetics of cTnI, a negative result within the first hours of the onset of symptoms does not rule out myocardial infarction with certainty. If myocardial infarction is still suspected, repeat the test at appropriate intervals.   Basic metabolic panel     Status: None   Collection Time: 07/23/14  4:00 AM  Result Value Ref Range   Sodium 140 137 - 147 mEq/L   Potassium 3.8 3.7 - 5.3 mEq/L   Chloride 103 96 - 112 mEq/L   CO2 26 19 - 32 mEq/L   Glucose, Bld 93 70 - 99 mg/dL  BUN 21 6 - 23 mg/dL   Creatinine, Ser 0.79 0.50 - 1.35 mg/dL   Calcium 8.7 8.4 - 10.5 mg/dL   GFR calc non Af Amer >90 >90 mL/min   GFR calc Af Amer >90 >90 mL/min    Comment: (NOTE) The eGFR has been calculated using the CKD EPI equation. This calculation has not been validated in all clinical situations. eGFR's persistently <90 mL/min signify possible Chronic Kidney Disease.    Anion gap 11 5 - 15  Basic metabolic panel     Status: Abnormal   Collection Time: 07/24/14  4:59 AM  Result Value Ref Range   Sodium 141 137 - 147 mEq/L   Potassium 3.5 (L) 3.7 - 5.3 mEq/L   Chloride 103 96 - 112 mEq/L   CO2 23 19 - 32 mEq/L   Glucose, Bld 150 (H) 70 -  99 mg/dL   BUN 20 6 - 23 mg/dL   Creatinine, Ser 0.76 0.50 - 1.35 mg/dL   Calcium 9.1 8.4 - 10.5 mg/dL   GFR calc non Af Amer >90 >90 mL/min   GFR calc Af Amer >90 >90 mL/min    Comment: (NOTE) The eGFR has been calculated using the CKD EPI equation. This calculation has not been validated in all clinical situations. eGFR's persistently <90 mL/min signify possible Chronic Kidney Disease.    Anion gap 15 5 - 15   Labs are reviewed .  Current Facility-Administered Medications  Medication Dose Route Frequency Provider Last Rate Last Dose  . acetaminophen (TYLENOL) tablet 650 mg  650 mg Oral Q6H PRN [name], MD   650 mg at 07/24/14 0026   Or  . acetaminophen (TYLENOL) suppository 650 mg  650 mg Rectal Q6H PRN [name], MD      . antiseptic oral rinse (CPC / CETYLPYRIDINIUM CHLORIDE 0.05%) solution 7 mL  7 mL Mouth Rinse BID Robbie Lis, MD   7 mL at 07/23/14 1000  . dicyclomine (BENTYL) tablet 20 mg  20 mg Oral Q6H PRN Elwyn Lade, PA-C      . DULoxetine (CYMBALTA) DR capsule 60 mg  60 mg Oral Daily [name], MD   60 mg at 07/22/14 1016  . folic acid injection 1 mg  1 mg Intravenous Daily [name], MD   1 mg at 07/23/14 0946  . haloperidol lactate (HALDOL) injection 2 mg  2 mg Intravenous Q6H PRN Robbie Lis, MD      . hydrOXYzine (ATARAX/VISTARIL) tablet 25 mg  25 mg Oral Q6H PRN Elwyn Lade, PA-C      . LORazepam (ATIVAN) injection 2-3 mg  2-3 mg Intravenous Q1H PRN [name], MD   2 mg at 07/23/14 2250  . LORazepam (ATIVAN) tablet 0-4 mg  0-4 mg Oral Q12H Elwyn Lade, PA-C   1 mg at 07/21/14 1730  . methocarbamol (ROBAXIN) tablet 500 mg  500 mg Oral Q8H PRN Elwyn Lade, PA-C      . morphine 2 MG/ML injection 2 mg  2 mg Intravenous Q4H PRN [name], MD   2 mg at 07/22/14 2017  . nicotine (NICODERM CQ - dosed in mg/24 hours) patch 21 mg  21 mg Transdermal Daily Elwyn Lade, PA-C   21 mg at 07/23/14  0949  . ondansetron (ZOFRAN) injection 4 mg  4 mg Intravenous Q6H PRN [name], MD      . pantoprazole (PROTONIX) injection 40 mg  40 mg Intravenous QHS [name], MD  40 mg at 07/23/14 2250  . potassium chloride SA (K-DUR,KLOR-CON) CR tablet 40 mEq  40 mEq Oral Once Robbie Lis, MD      . sodium chloride 0.9 % injection 3 mL  3 mL Intravenous Q12H [name], MD   3 mL at 07/23/14 2250  . thiamine (B-1) injection 100 mg  100 mg Intravenous Daily [name], MD   100 mg at 07/23/14 0946  . traMADol (ULTRAM) tablet 50 mg  50 mg Oral Q6H PRN [name], MD   50 mg at 07/24/14 0026    Psychiatric Specialty Exam: Physical Exam as per history and physical  ROS chronic pain   Blood pressure 126/79, pulse 102, temperature 98.2 F (36.8 C), temperature source Oral, resp. rate 20, SpO2 100 %.There is no weight on file to calculate BMI.  General Appearance: Disheveled  Eye Contact::  Good  Speech:  Clear and Coherent  Volume:  Normal  Mood:  Depressed  Affect:  Appropriate and Congruent  Thought Process:  Coherent and Goal Directed  Orientation:  Full (Time, Place, and Person)  Thought Content:  Rumination  Suicidal Thoughts:  No  Homicidal Thoughts:  No  Memory:  Immediate;   Fair Recent;   Fair  Judgement:  Impaired  Insight:  Fair  Psychomotor Activity:  Decreased  Concentration:  Good  Recall:  Good  Fund of Knowledge:Good  Language: Good  Akathisia:  NA  Handed:  Right  AIMS (if indicated):     Assets:  Communication Skills Desire for Improvement Financial Resources/Insurance Housing Intimacy Leisure Time Resilience Social Support  Sleep:      Musculoskeletal: Strength & Muscle Tone: within normal limits Gait & Station: unable to stand Patient leans: N/A  Treatment Plan Summary: Daily contact with patient to assess and evaluate symptoms and progress in treatment Medication management  Adisen Bennion,JANARDHAHA  R. 07/24/2014 12:19 PM

## 2014-07-25 DIAGNOSIS — G894 Chronic pain syndrome: Secondary | ICD-10-CM | POA: Diagnosis present

## 2014-07-25 DIAGNOSIS — Z515 Encounter for palliative care: Secondary | ICD-10-CM

## 2014-07-25 MED ORDER — VITAMIN B-1 100 MG PO TABS
100.0000 mg | ORAL_TABLET | Freq: Every day | ORAL | Status: DC
  Administered 2014-07-26: 100 mg via ORAL
  Filled 2014-07-25: qty 1

## 2014-07-25 MED ORDER — LORAZEPAM 2 MG/ML IJ SOLN
1.0000 mg | Freq: Four times a day (QID) | INTRAMUSCULAR | Status: DC | PRN
  Administered 2014-07-26: 1 mg via INTRAVENOUS
  Filled 2014-07-25: qty 1

## 2014-07-25 MED ORDER — PANTOPRAZOLE SODIUM 40 MG PO TBEC
40.0000 mg | DELAYED_RELEASE_TABLET | Freq: Every day | ORAL | Status: DC
  Administered 2014-07-25 – 2014-07-26 (×2): 40 mg via ORAL
  Filled 2014-07-25 (×2): qty 1

## 2014-07-25 MED ORDER — FOLIC ACID 1 MG PO TABS
1.0000 mg | ORAL_TABLET | Freq: Every day | ORAL | Status: DC
  Administered 2014-07-26: 1 mg via ORAL
  Filled 2014-07-25: qty 1

## 2014-07-25 NOTE — Progress Notes (Signed)
PT Cancellation Note / Screen  Patient Details Name: Dale [name] MRN: [id number] DOB: 13-Apr-1956   Cancelled Treatment:    Reason Eval/Treat Not Completed: PT screened, no needs identified, will sign off  Received new order for PT.  Pt was evaluated 07/24/14 please refer to this note.  Recommended outpatient PT f/u or HHPT if outpatient not possible.  Called RN who reports no known changes in pt's mobility since yesterday.  PT to sign off.   Damichael Hofman,KATHrine E 07/25/2014, 4:23 PM Carmelia Bake, PT, DPT 07/25/2014 Pager: [phone]

## 2014-07-25 NOTE — Progress Notes (Addendum)
Clinical Social Work Department CLINICAL SOCIAL WORK PSYCHIATRY SERVICE LINE ASSESSMENT 07/25/2014  Patient:  Dale [name]  Account:  [id number]  Admit Date:  07/21/2014  Clinical Social Worker:  Sindy Messing, LCSW  Date/Time:  07/25/2014 12:00 N Referred by:  Physician  Date referred:  07/25/2014 Reason for Referral  Psychosocial assessment   Presenting Symptoms/Problems (In the person's/family's own words):   Psych consulted due to AMS.   Abuse/Neglect/Trauma History (check all that apply)  Denies history   Abuse/Neglect/Trauma Comments:   Psychiatric History (check all that apply)  Outpatient treatment  Inpatient/hospitilization   Psychiatric medications:  Cymbalta 60 mg  Ativan 2-3 mg   Current Mental Health Hospitalizations/Previous Mental Health History:   Patient reports he was diagnosed with bipolar disorder in the past. Patient currently receiving medication management on outpatient basis. Patient reports no medication given for bipolar recently but was taking Ativan or Xanax to assist with pain and anxiety.   Current provider:   Dr. Jari Favre and Date:   [place], [place]   Current Medications:   Scheduled Meds:      . antiseptic oral rinse  7 mL Mouth Rinse BID  . DULoxetine  60 mg Oral Daily  . [START ON 56/11/3327] folic acid  1 mg Oral Daily  . nicotine  21 mg Transdermal Daily  . pantoprazole  40 mg Oral Daily  . sodium chloride  3 mL Intravenous Q12H  . [START ON 07/26/2014] thiamine  100 mg Oral Daily        Continuous Infusions:      PRN Meds:.acetaminophen **OR** acetaminophen, dicyclomine, haloperidol lactate, hydrOXYzine, LORazepam, methocarbamol, morphine injection, [DISCONTINUED] ondansetron **OR** ondansetron (ZOFRAN) IV, traMADol       Previous Impatient Admission/Date/Reason:   Patient was placed at [facility] in July 2014 and over the summer in 2015.   Emotional Health / Current Symptoms    Suicide/Self Harm  None reported   Suicide  attempt in the past:   Patient denies any SI and denies any previous attempts.   Other harmful behavior:   None reported   Psychotic/Dissociative Symptoms  Confusion   Other Psychotic/Dissociative Symptoms:   Patient reports family brought him to the ED because he was confused but reports that his confusion has cleared now. Patient's sister at bedside and confirms patient's mentation has been decreasing over the past 2 weeks but appears to be back to baseline today.    Attention/Behavioral Symptoms  Within Normal Limits   Other Attention / Behavioral Symptoms:   Patient engaged during assessment.    Cognitive Impairment  Within Normal Limits   Other Cognitive Impairment:   Patient alert and oriented during assessment.    Mood and Adjustment  Mood Congruent    Stress, Anxiety, Trauma, Any Recent Loss/Stressor  Anxiety   Anxiety (frequency):   Patient reports that he takes medication for pain/anxiety that is helpful.   Phobia (specify):   N/A   Compulsive behavior (specify):   N/A   Obsessive behavior (specify):   N/A   Other:   N/A   Substance Abuse/Use  None   SBIRT completed (please refer for detailed history):  N  Self-reported substance use:   Patient denies any substance use. Patient reports that he uses pain medication as prescribed.   Urinary Drug Screen Completed:  Y Alcohol level:   <11    Environmental/Housing/Living Arrangement  Stable housing   Who is in the home:   Alone   Emergency contact:  Nancy-sister  Blake-son   Financial  Medicare  Medicaid   Patient's Strengths and Goals (patient's own words):   Patient has supportive family.   Clinical Social Worker's Interpretive Summary:   CSW received referral to complete psychosocial assessment. CSW reviewed chart and met with patient at bedside. CSW introduced myself and explained role. Patient's sister Izora Gala) present and patient agreeable for sister to be involved during  assessment.    Patient reports that he lives at home alone in sister's pool house. Patient is not married but has two children. Patient has a son that lives locally and his dtr lives on the [place]. Patient reports that he was confused and does not remember coming to the hospital.    Sister reports that patient lives close by and she noticed a change in his mental status over the past two weeks. Patient was going to [facility] for back pain due to an accident in 2006. Patient reports that he has not been happy with service at pain clinic and might go to another clinic. Patient reports he was prescribed Opana and always took medication as prescribed but felt he did not need to take medication because it made him too drowsy. Patient reports that about 2 weeks ago he started weaning himself from pain medication and that is when family noticed that patient was more confused. Patient reports that this happened in the past before when he has tried to stop his medication on his own as well.    Patient reports he continues to see Dr. Reece Levy on outpatient basis for medication management. Patient was diagnosed with bipolar several years ago but reports he is not taking any medication for bipolar disorder. Patient reports that he continues to follow up with Dr. Reece Levy for anxiety prescription which also helps with his pain. Patient denies any current MH concerns. Patient denies any SI or HI or psychotic symptoms. Patient reports that he is feeling better and hopeful to DC soon.    Patient and sister had questions about scheduling an appointment to be evaluated by psych MD. CSW explained that psych MD would evaluate patient as soon as he could. CSW explained IVC status. Patient was served on 11/27 and paperwork is valid until 12/4. Patient denies any current hallucinations that were listed on IVC paperwork.    CSW will continue to follow and will assist with any recommendations provided by psych MD. Patient  agreeable for sister and son to be involved with treatment plan.   Disposition:  Recommend Psych CSW continuing to support while in hospital   [place], [place] with patient and son at bedside. Son reports that patient is much clearer today and he wants to discuss DC plans. Son reports patient has stopped medications in the past and had similar reactions. CSW encouraged patient and son to talk with medical providers prior to stopping any medication. Patient reports he does not have a good relationship with pain clinic and has tried to tell them in the past that he wants alternative options. CSW encouraged patient to talk with workers comp representative about getting a referral to another pain clinic. Patient reports good relationship with Dr. Reece Levy and will talk with him if he wants to change medications in the future. Son reports that patient does a good job caring for himself and managing his own medications. Son reports that patient was not trying to harm himself or cause problems when stopping medication but now understands that patient needs  medical supervision if wanting to change medications. CSW explained psych MD to make recommendations and CSW will continue to follow.

## 2014-07-25 NOTE — Progress Notes (Signed)
The patient is receiving Pantoprazole, Folic Acid, and Thiamine by the intravenous route.  Based on criteria approved by the Pharmacy and [place], these medications are being converted to the equivalent oral dose form.  These criteria include: -No Active GI bleeding -Able to tolerate diet of full liquids (or better) or tube feeding -Able to tolerate other medications by the oral or enteral route  If you have any questions about this conversion, please contact the Pharmacy Department (phone [phone]).  Thank you.  Lindell Spar, PharmD, BCPS Pager: [phone] 07/25/2014 10:50 AM

## 2014-07-25 NOTE — Progress Notes (Signed)
Patient ID: Dale [name], male   DOB: November 22, 1955, 58 y.o.   MRN: [id number] [facility] PROGRESS NOTE  Dale [name] LJQ:[id number] DOB: Jul 17, 1956 DOA: 07/21/2014 PCP: [name], MD  Brief narrative:    58 y.o. malewith history of back pain on multiple pain medications, anxiety and depression who presented to [facility] ED with opioid and benzodiazepine withdrawal. Per patient's family, patient has misplaced prescriptions and was not able to fill the medications. Patient was seen in ED one day prior to this admission for confusion thought to be secondary to opioid and benzodiazepine withdrawal. He was sent home with prescription for oxycodone. Patient subsequently returned to ED because of worsening confusion and delirium as well as hallucinations. Vitals signs on admission were stable. Urine drug screen was unremarkable and alcohol level was within normal limits. Patient was admitted to stepdown unit because of active hallucinations and delirium secondary to opioid and benzodiazepine withdrawal.  Of note, patient's son [name] apparently very upset that he was not updated on his father's condition. Family was never present at the time of rounding. RN never informed this MD of family asking for updates until 07/24/2014. This MD had a conversation with patient's son for 45 minutes who requested psych MD to be present when they evaluate Dale [name]. Please note, son was asked at the time of admission in ED to leave the premises as he recorded with his cell phone evaluation of his father by RN and staff. He refused to abide by rule as he was notified recording is hippa violation and he was asked to leave the hospital.  Since patient's sone is NOK and per psych patient does not have capacity to make his medical  treatment decisions or making living arrangements, social work asked to help with skilled nursing facility search.   Assessment/Plan:     Principal problem: Acute drug-induced  encephalopathy - Likely secondary to opioid and benzodiazepine withdrawal. Urine drug screen and alcohol level within normal limits on admission. - CT head on admission showed no acute intracranial findings  - Patient was on CIWA protocol - transferred to telemtry floor 07/23/2014. Mental status better but per psych pt has no capacity  - SW assisting discharge plan to skilled nursing facility.  - palliative care consulted for pain management, pt with long standing h/o narcotic abuse, potential addition versus tolerance for extensive back pain issue - will follow up on recommendations.   Active Problems: Hypokalemia / hypomagnesemia - unclear etiology - Supplemented magnesium and potassium.  - check BMP in am  Moderate protein calorie malnutrition - nutrition consulted   Pulmonary nodule - seen on CXR on admission in the left mid to upper lung. Recommend repeat dedicated PA and lateral chest x-ray when the patient is able to hold breath. If the nodular opacity persists, chest CT may become warranted.    DVT Prophylaxis  - SCD's bilaterally   Code Status: Full.  Family Communication: Family not at the bedside this am Disposition Plan: remains inpatient    IV access:   Peripheral IV  Procedures and diagnostic studies:    Ct Head Wo Contrast 07/21/2014 CT HEAD 1. No acute intracranial abnormality. CT CSPINE 1. No acute fracture or malalignment. 2. Multilevel cervical spondylosis with at least moderate narrowing of the central canal at C3-C4 and C6-C7 secondary to posterior disc osteophyte complexes. 3. Emphysematous change in the visualized upper lungs.   Ct Cervical Spine Wo Contrast 07/21/2014 CT HEAD 1. No acute intracranial abnormality. CT CSPINE 1. No acute  fracture or malalignment. 2. Multilevel cervical spondylosis with at least moderate narrowing of the central canal at C3-C4 and C6-C7 secondary to posterior disc osteophyte complexes. 3. Emphysematous change  in the visualized upper lungs.   Dg Chest Port 1 View 07/21/2014 1. No active cardiopulmonary process. 2. Possible 11 mm pulmonary nodule in the left mid to upper lung. Recommend repeat dedicated PA and lateral chest x-ray when the patient is able to breath hold. If the nodular opacity persists, chest CT may become warranted.   Medical Consultants:   Psychiatry   Palliative care for pain management  Other Consultants:   Physical therapy  Social work  IAnti-Infectives:    None   Leisa Lenz, MD  [facility] Pager [phone]  If 7PM-7AM, please contact night-coverage [email or web address] Password [email or web address] 07/25/2014, 1:02 PM   LOS: 4 days    HPI/Subjective: No acute overnight events.  Objective: Filed Vitals:   07/23/14 2102 07/24/14 1412 07/24/14 2007 07/25/14 0652  BP: 126/79 151/96 129/81 149/94  Pulse: 102 97 96 78  Temp: 98.2 F (36.8 C) 97.6 F (36.4 C) 98.8 F (37.1 C) 98.8 F (37.1 C)  TempSrc: Oral Oral Oral Oral  Resp: 20 18 22 20   SpO2: 100% 100% 98% 98%    Intake/Output Summary (Last 24 hours) at 07/25/14 1302 Last data filed at 07/24/14 1412  Gross per 24 hour  Intake    240 ml  Output      0 ml  Net    240 ml    Exam:   General:  Pt is alert, no distress  Cardiovascular: Regular rate and rhythm, S1/S2, no murmurs  Respiratory: Clear to auscultation bilaterally, no wheezing, no crackles, no rhonchi  Abdomen: Soft, non tender, non distended, bowel sounds present  Extremities: No edema, pulses DP and PT palpable bilaterally  Neuro: Grossly nonfocal  Data Reviewed: Basic Metabolic Panel:  Recent Labs Lab 07/21/14 1702 07/21/14 2331 07/22/14 0155 07/23/14 0400 07/24/14 0459  NA 137  --  140 140 141  K 3.8  --  3.4* 3.8 3.5*  CL 97  --  101 103 103  CO2 22  --  21 26 23   GLUCOSE 84  --  85 93 150*  BUN 35*  --  31* 21 20  CREATININE 1.03  --  0.92 0.79 0.76  CALCIUM 10.1  --  9.5 8.7 9.1  MG  --  1.3* 1.4*  --   --    PHOS  --  2.2* 2.4  --   --    Liver Function Tests:  Recent Labs Lab 07/21/14 1702 07/22/14 0155  AST 137* 159*  ALT 43 45  ALKPHOS 76 65  BILITOT 0.9 1.0  PROT 8.4* 7.3  ALBUMIN 4.7 4.2   No results for input(s): LIPASE, AMYLASE in the last 168 hours. No results for input(s): AMMONIA in the last 168 hours. CBC:  Recent Labs Lab 07/21/14 1702 07/22/14 0155  WBC 9.7 8.9  NEUTROABS 6.6  --   HGB 14.1 13.2  HCT 41.1 38.6*  MCV 90.5 89.8  PLT 238 215   Cardiac Enzymes:  Recent Labs Lab 07/22/14 0155 07/22/14 0703 07/22/14 1410  TROPONINI <0.30 <0.30 <0.30   BNP: Invalid input(s): POCBNP CBG: No results for input(s): GLUCAP in the last 168 hours.  Recent Results (from the past 240 hour(s))  MRSA PCR Screening     Status: None   Collection Time: 07/22/14 12:55 AM  Result Value Ref Range Status  MRSA by PCR NEGATIVE NEGATIVE Final     Scheduled Meds: . antiseptic oral rinse  7 mL Mouth Rinse BID  . DULoxetine  60 mg Oral Daily  . [START ON 84/02/8411] folic acid  1 mg Oral Daily  . nicotine  21 mg Transdermal Daily  . pantoprazole  40 mg Oral Daily  . sodium chloride  3 mL Intravenous Q12H  . [START ON 07/26/2014] thiamine  100 mg Oral Daily

## 2014-07-25 NOTE — Consult Note (Signed)
Patient Dale [name]      DOB: 1956/08/10      XHB:[id number]     Consult Note from the Palliative Medicine Team at [facility] Requested by: Dr Charlies Silvers      PCP: [name], MD Reason for Consultation: Pain Management    Phone Number:[phone]  Assessment/Recommendations: 58 yo male with PMHx of chronic pain, anxiety d/o, depression who presented with altered mental status presumed 2/2 pain medicine/benzo withdrawal.    Challenging situation. Has been to pain clinic since back injury in 2006 with escalating doses.  I checked [place] CSRS and he appears to be prescribed Opana ER 40mg  BID with IR Opana 10mg  TID PRN.  He states that he was inconsistent with taking 40mg  dose.  If he was taking both his ER+IR opana, his morphine equivalent daily dose is over 300mg  of oral morphine. He is obviously on much less now.  He has not used PRN IV morphine since 11/28. Only using few doses of PRN tramadol. Overall, he reports that pain is not that bad currently.  Given issues both from psychiatric and pain standpoint, it makes sense to avoid high dose opioids as much as able.  As long as he continues to do okay on current regimen, I would not restart opana.  I am not a chronic pain specialist, but I would think a multi-disciplinary pain clinic would work with him on non-opioid pain management (injections, PT, adjuvants,etc).  He does not like the current pain clinic he goes to because he reports they have no interest in taking him off medications.  I gave him and his son the name of several pain clinics in this area if they wish to have second opinion. His son Dale [name] has questions about both toleranc and addiction which Dale [name] is certainly prone to.  I am not sure how much buy-in to weaning off pain meds Dale [name] has or if there is also some component of trying to appease his son.  They state that they have been wanting his pain meds decreased for some time and that this hasn't happened. I think it  would be unusual for pain clinic to not comply with this request.    Given that his pain is currently controlled, I would continue current regimen. If needing IV morphine again, can d/c with scripts for both Tramadol and hydrocodone 5-10mg  q4h PRN.  He will need follow-up with PCP and pain clinic as overall this will take continued work as outpatient.  I think minimizing opioids as much is possible will be of best benefit to him.  Certainly cymbalta may help with neuropathic component and addition of neurontin or lyrica could be considered. I would leave that to discretion of his outpatient providers as he seems to be tolerating things well currently.   Will sign off. My partner Dr Hilma Favors will be on service tomorrow and can be reached if additional questions or concerns were to arise.     Brief HPI: 58 y.o. male with PMHx of anxiety/depression, chronic back pain since fall at work in 2006. Hospitalization in 2014 for psychosis, AKI.  Has mostly lower back pain with radiation into flanks/thighs.  Sharp/achy pains which at times become severe. Worse with standing straight and worse when he has spent time holding his grandkids.  May be some component of N/T pain. Has been going to chronic pain clinic for "years".  Has been on Opana ER/IR for ~4 years.  Also has tried multiple other pain medications  in the past (oxycodone for sure and some others).  He intermittently takes opana 40mg  ER because it makes him to sleepy.  He finds that 10mg  IR opana works well and does not cause as much drowsiness.  However, he feels like pain pills are just masking pain and not helping.  He stopped taking pain medicines for several days prior to admission.  He reports that he just wanted to get off these medicines.  He reports never taking more pain medication than prescribed.  Both he and his son want him to get off opioid pain medications.  They were about both addiction and tolerance.       PMH:  Past Medical History   Diagnosis Date  . Back pain   . Acute renal failure 03/08/2013     PSH: Past Surgical History  Procedure Laterality Date  . Leg surgery      left femur fx   I have reviewed the [place] and SH and  If appropriate update it with new information. Allergies  Allergen Reactions  . Penicillins Hives   Scheduled Meds: . antiseptic oral rinse  7 mL Mouth Rinse BID  . DULoxetine  60 mg Oral Daily  . [START ON 44/0/1027] folic acid  1 mg Oral Daily  . nicotine  21 mg Transdermal Daily  . pantoprazole  40 mg Oral Daily  . sodium chloride  3 mL Intravenous Q12H  . [START ON 07/26/2014] thiamine  100 mg Oral Daily   Continuous Infusions:  PRN Meds:.acetaminophen **OR** acetaminophen, dicyclomine, haloperidol lactate, hydrOXYzine, LORazepam, methocarbamol, morphine injection, [DISCONTINUED] ondansetron **OR** ondansetron (ZOFRAN) IV, traMADol    BP 149/94 mmHg  Pulse 78  Temp(Src) 98.8 F (37.1 C) (Oral)  Resp 20  SpO2 98%    No intake or output data in the 24 hours ending 07/25/14 1710  Physical Exam:  General: Alert, NAD HEENT:  [place], sclera anicteric Skin: warm/dry  Labs: CBC    Component Value Date/Time   WBC 8.9 07/22/2014 0155   RBC 4.30 07/22/2014 0155   HGB 13.2 07/22/2014 0155   HCT 38.6* 07/22/2014 0155   PLT 215 07/22/2014 0155   MCV 89.8 07/22/2014 0155   MCH 30.7 07/22/2014 0155   MCHC 34.2 07/22/2014 0155   RDW 13.4 07/22/2014 0155   LYMPHSABS 1.8 07/21/2014 1702   MONOABS 1.3* 07/21/2014 1702   EOSABS 0.0 07/21/2014 1702   BASOSABS 0.0 07/21/2014 1702    BMET    Component Value Date/Time   NA 141 07/24/2014 0459   K 3.5* 07/24/2014 0459   CL 103 07/24/2014 0459   CO2 23 07/24/2014 0459   GLUCOSE 150* 07/24/2014 0459   BUN 20 07/24/2014 0459   CREATININE 0.76 07/24/2014 0459   CALCIUM 9.1 07/24/2014 0459   GFRNONAA >90 07/24/2014 0459   GFRAA >90 07/24/2014 0459    CMP     Component Value Date/Time   NA 141 07/24/2014 0459   K 3.5*  07/24/2014 0459   CL 103 07/24/2014 0459   CO2 23 07/24/2014 0459   GLUCOSE 150* 07/24/2014 0459   BUN 20 07/24/2014 0459   CREATININE 0.76 07/24/2014 0459   CALCIUM 9.1 07/24/2014 0459   PROT 7.3 07/22/2014 0155   ALBUMIN 4.2 07/22/2014 0155   AST 159* 07/22/2014 0155   ALT 45 07/22/2014 0155   ALKPHOS 65 07/22/2014 0155   BILITOT 1.0 07/22/2014 0155   GFRNONAA >90 07/24/2014 0459   GFRAA >90 07/24/2014 0459    11/27 CT Head/c-spine  IMPRESSION: CT HEAD  1. No acute intracranial abnormality. CT CSPINE  1. No acute fracture or malalignment. 2. Multilevel cervical spondylosis with at least moderate narrowing of the central canal at C3-C4 and C6-C7 secondary to posterior disc osteophyte complexes. 3. Emphysematous change in the visualized upper lungs.    Total Time: 50 minutes Greater than 50%  of this time was spent counseling and coordinating care related to the above assessment and plan.  Doran Clay D.O. Palliative Medicine Team at [facility]  Pager: [phone] Team Phone: [phone]

## 2014-07-26 ENCOUNTER — Inpatient Hospital Stay (HOSPITAL_COMMUNITY): Payer: Medicare Other

## 2014-07-26 LAB — BASIC METABOLIC PANEL
Anion gap: 15 (ref 5–15)
BUN: 14 mg/dL (ref 6–23)
CHLORIDE: 100 meq/L (ref 96–112)
CO2: 25 mEq/L (ref 19–32)
Calcium: 10.1 mg/dL (ref 8.4–10.5)
Creatinine, Ser: 0.78 mg/dL (ref 0.50–1.35)
Glucose, Bld: 134 mg/dL — ABNORMAL HIGH (ref 70–99)
POTASSIUM: 4 meq/L (ref 3.7–5.3)
SODIUM: 140 meq/L (ref 137–147)

## 2014-07-26 MED ORDER — HYDROCODONE-ACETAMINOPHEN 5-325 MG PO TABS: 1.0000 | ORAL_TABLET | Freq: Four times a day (QID) | ORAL | Status: DC | PRN

## 2014-07-26 MED ORDER — TRAMADOL HCL 50 MG PO TABS: 50.0000 mg | ORAL_TABLET | Freq: Four times a day (QID) | ORAL | Status: DC | PRN

## 2014-07-26 MED ORDER — LORAZEPAM 1 MG PO TABS: 1.0000 mg | ORAL_TABLET | Freq: Every evening | ORAL | Status: DC | PRN

## 2014-07-26 NOTE — Progress Notes (Signed)
Spoke with pt concerning discharge plans with Home Health. Pt declined [facility] at present time. States that he needs water therapy, that he will go to [facility] for that.

## 2014-07-26 NOTE — Plan of Care (Signed)
Problem: Phase II Progression Outcomes Goal: IV changed to normal saline lock Outcome: Completed/Met Date Met:  07/26/14     

## 2014-07-26 NOTE — Progress Notes (Signed)
Clinical Social Work  Psych MD has cleared patient to follow up on outpatient basis. Son is assisting patient with finding a new pain clinic and patient plans to follow up with Dr. Reece Levy. MD signed Notice of Commitment Change form which was faxed to [facility] and original copy placed on chart. CSW updated RN of DC plans. CSW is signing off but available if needed.  [place],  [phone]

## 2014-07-26 NOTE — Discharge Summary (Signed)
Physician Discharge Summary  Press [place]ale IOE:[id number] DOB: [birth year] DOA: 07/21/2014  PCP: [name], MD  Admit date: 07/21/2014 Discharge date: 07/26/2014  Recommendations for Outpatient Follow-up:  1. Pt needs to follow up with spine and scoliosis specialist 08/08/2050 at 1:30 pm 2. Patient will follow-up with a psychiatrist per scheduled appointment. Patient's son will schedule an appointment for psychiatrist.  Discharge Diagnoses:  Principal Problem:   Acute encephalopathy Active Problems:   Benzodiazepine withdrawal   Delirium tremens   Malnutrition of moderate degree   Palliative care encounter   Chronic pain syndrome    Discharge Condition: stable   Diet recommendation: as tolerated   History of present illness:  58 y.o. malewith history of back pain on multiple pain medications, anxiety and depression who presented to [facility] ED with opioid and benzodiazepine withdrawal. Per patient's family, patient has misplaced prescriptions and was not able to fill the medications. Patient was seen in ED one day prior to this admission for confusion thought to be secondary to opioid and benzodiazepine withdrawal. He was sent home with prescription for oxycodone. Patient subsequently returned to ED because of worsening confusion and delirium as well as hallucinations. Vitals signs on admission were stable. Urine drug screen was unremarkable and alcohol level was within normal limits. Patient was admitted to stepdown unit because of active hallucinations and delirium secondary to opioid and benzodiazepine withdrawal.  Of note, patient's son [name] apparently very upset that he was not updated on his father's condition. Family was never present at the time of rounding. RN never informed this MD of family asking for updates until 07/24/2014. This MD had a conversation with patient's son for 45 minutes who requested psych MD to be present when they evaluate Mr. Tedesco. Please note, son was  asked at the time of admission in ED to leave the premises as he recorded with his cell phone evaluation of his father by RN and staff. He refused to abide by rule as he was notified recording is hippa violation and he was asked to leave the hospital.  Physical therapy evaluated patient 2 times and has recommended home health physical therapy on discharge. Orders in place.   Assessment/Plan:     Principal problem: Acute drug-induced encephalopathy - Likely secondary to opioid and benzodiazepine withdrawal. Urine drug screen and alcohol level within normal limits on admission. - CT head on admission showed no acute intracranial findings  - Patient was on CIWA protocol - transferred to telemetry floor 07/23/2014. Mental status better but per psych pt has no capacity  - Patient has been seen by palliative care team for pain management. Recommendation is for as needed hydrocodone and as needed tramadol to be alternating for pain relief. On discharge, we recommended stopping Opana altogether. - Patient will go home with orders for physical therapy, occupational therapy, home health RN and home health aide orders all placed   Active Problems: Hypokalemia / hypomagnesemia - unclear etiology - Supplemented magnesium and potassium.    Moderate protein calorie malnutrition - nutrition consulted   Pulmonary nodule - seen on CXR on admission in the left mid to upper lung. Recommend repeat dedicated PA and lateral chest x-ray when the patient is able to hold breath. If the nodular opacity persists, chest CT may become warranted.  - will get repeat CXR today   DVT Prophylaxis  - SCD's bilaterally   Code Status: Full.  Family Communication: Family not at the bedside this am; updated patient's son multiple times on 07/25/2014 as  well as 07/26/2014 prior to discharge.   IV access:   Peripheral IV  Procedures and diagnostic studies:   Ct Head Wo Contrast 07/21/2014 CT  HEAD 1. No acute intracranial abnormality. CT CSPINE 1. No acute fracture or malalignment. 2. Multilevel cervical spondylosis with at least moderate narrowing of the central canal at C3-C4 and C6-C7 secondary to posterior disc osteophyte complexes. 3. Emphysematous change in the visualized upper lungs.   Ct Cervical Spine Wo Contrast 07/21/2014 CT HEAD 1. No acute intracranial abnormality. CT CSPINE 1. No acute fracture or malalignment. 2. Multilevel cervical spondylosis with at least moderate narrowing of the central canal at C3-C4 and C6-C7 secondary to posterior disc osteophyte complexes. 3. Emphysematous change in the visualized upper lungs.   Dg Chest Port 1 View 07/21/2014 1. No active cardiopulmonary process. 2. Possible 11 mm pulmonary nodule in the left mid to upper lung. Recommend repeat dedicated PA and lateral chest x-ray when the patient is able to breath hold. If the nodular opacity persists, chest CT may become warranted.   Medical Consultants:   Psychiatry   Palliative care for pain management  Other Consultants:   Physical therapy  Social work  IAnti-Infectives:    None   Signed:  Leisa Lenz, MD  [facility] 07/26/2014, 11:11 AM  Pager #: [phone]   Discharge Exam: Filed Vitals:   07/26/14 0552  BP: 128/89  Pulse: 95  Temp: 98 F (36.7 C)  Resp: 20   Filed Vitals:   07/25/14 0652 07/25/14 1728 07/25/14 2135 07/26/14 0552  BP: 149/94 127/89 144/85 128/89  Pulse: 78 108 91 95  Temp: 98.8 F (37.1 C)  99.2 F (37.3 C) 98 F (36.7 C)  TempSrc: Oral  Oral Oral  Resp: 20 20 20 20   SpO2: 98% 100% 98% 98%    General: Pt is alert, follows commands appropriately, not in acute distress Cardiovascular: Regular rate and rhythm, S1/S2 +, no murmurs Respiratory: Clear to auscultation bilaterally, no wheezing, no crackles, no rhonchi Abdominal: Soft, non tender, non distended, bowel sounds +, no guarding Extremities: no edema, no  cyanosis, pulses palpable bilaterally DP and PT Neuro: Grossly nonfocal  Discharge Instructions  Discharge Instructions    Call MD for:  difficulty breathing, headache or visual disturbances    Complete by:  As directed      Call MD for:  persistant dizziness or light-headedness    Complete by:  As directed      Call MD for:  redness, tenderness, or signs of infection (pain, swelling, redness, odor or green/yellow discharge around incision site)    Complete by:  As directed      Call MD for:  severe uncontrolled pain    Complete by:  As directed      Diet - low sodium heart healthy    Complete by:  As directed      Discharge instructions    Complete by:  As directed      Increase activity slowly    Complete by:  As directed             Medication List    STOP taking these medications        ALPRAZolam 1 MG tablet  Commonly known as:  XANAX     oxyCODONE 5 MG immediate release tablet  Commonly known as:  ROXICODONE     oxymorphone 40 MG 12 hr tablet  Commonly known as:  OPANA ER      TAKE these medications  cyclobenzaprine 10 MG tablet  Commonly known as:  FLEXERIL  Take 10 mg by mouth 2 (two) times daily as needed. For muscle spasms.     DULoxetine 60 MG capsule  Commonly known as:  CYMBALTA  Take 60 mg by mouth daily.     feeding supplement (ENSURE COMPLETE) Liqd  Take 237 mLs by mouth 3 (three) times daily with meals.     HYDROcodone-acetaminophen 5-325 MG per tablet  Commonly known as:  NORCO/VICODIN  Take 1-2 tablets by mouth every 6 (six) hours as needed for moderate pain.     LORazepam 1 MG tablet  Commonly known as:  ATIVAN  Take 1 tablet (1 mg total) by mouth at bedtime as needed for anxiety.     multivitamin with minerals Tabs tablet  Take 1 tablet by mouth daily.     traMADol 50 MG tablet  Commonly known as:  ULTRAM  Take 1 tablet (50 mg total) by mouth every 6 (six) hours as needed.           Follow-up Information    Follow up  with [facility], COLLEEN, PA-C On 08/08/2014.   Specialty:  Orthopedic Surgery   Why:  at 1: 30 pm   Contact information:   [facility] [address] [place] [address] [phone]        The results of significant diagnostics from this hospitalization (including imaging, microbiology, ancillary and laboratory) are listed below for reference.    Significant Diagnostic Studies: Ct Head Wo Contrast  07/21/2014   CLINICAL DATA:  58 year old male with altered mental status. Thought secondary to opioid and benzodiazepine withdrawal. Actively hallucinating and not following commands. Evaluate for organic disease.  EXAM: CT HEAD WITHOUT CONTRAST  CT CERVICAL SPINE WITHOUT CONTRAST  TECHNIQUE: Multidetector CT imaging of the head and cervical spine was performed following the standard protocol without intravenous contrast. Multiplanar CT image reconstructions of the cervical spine were also generated.  COMPARISON:  Prior CT scan of the head and cervical spine 05/27/2013  FINDINGS: CT HEAD FINDINGS  Negative for acute intracranial hemorrhage, acute infarction, mass, mass effect, hydrocephalus or midline shift. Gray-white differentiation is preserved throughout. No focal soft tissue or calvarial abnormality. Globes and orbits are symmetric bilaterally. Normal aeration of the mastoid air cells and visualized paranasal sinuses. Trace atherosclerotic calcification in the bilateral cavernous carotid arteries.  CT CERVICAL SPINE FINDINGS  No acute fracture, malalignment or prevertebral soft tissue swelling. Multilevel cervical spondylosis. Most significant posterior disc osteophyte complex at C3-C4 and C6-C7. At both levels there is likely moderate central canal stenosis. Unremarkable CT appearance of the thyroid gland. No acute soft tissue abnormality. Mild biapical emphysematous change.  IMPRESSION: CT HEAD  1. No acute intracranial abnormality. CT CSPINE  1. No acute fracture  or malalignment. 2. Multilevel cervical spondylosis with at least moderate narrowing of the central canal at C3-C4 and C6-C7 secondary to posterior disc osteophyte complexes. 3. Emphysematous change in the visualized upper lungs.   Electronically Signed   By: Jacqulynn Cadet M.D.   On: 07/21/2014 23:26   Ct Cervical Spine Wo Contrast  07/21/2014   CLINICAL DATA:  58 year old male with altered mental status. Thought secondary to opioid and benzodiazepine withdrawal. Actively hallucinating and not following commands. Evaluate for organic disease.  EXAM: CT HEAD WITHOUT CONTRAST  CT CERVICAL SPINE WITHOUT CONTRAST  TECHNIQUE: Multidetector CT imaging of the head and cervical spine was performed following the standard protocol without intravenous contrast. Multiplanar  CT image reconstructions of the cervical spine were also generated.  COMPARISON:  Prior CT scan of the head and cervical spine 05/27/2013  FINDINGS: CT HEAD FINDINGS  Negative for acute intracranial hemorrhage, acute infarction, mass, mass effect, hydrocephalus or midline shift. Gray-white differentiation is preserved throughout. No focal soft tissue or calvarial abnormality. Globes and orbits are symmetric bilaterally. Normal aeration of the mastoid air cells and visualized paranasal sinuses. Trace atherosclerotic calcification in the bilateral cavernous carotid arteries.  CT CERVICAL SPINE FINDINGS  No acute fracture, malalignment or prevertebral soft tissue swelling. Multilevel cervical spondylosis. Most significant posterior disc osteophyte complex at C3-C4 and C6-C7. At both levels there is likely moderate central canal stenosis. Unremarkable CT appearance of the thyroid gland. No acute soft tissue abnormality. Mild biapical emphysematous change.  IMPRESSION: CT HEAD  1. No acute intracranial abnormality. CT CSPINE  1. No acute fracture or malalignment. 2. Multilevel cervical spondylosis with at least moderate narrowing of the central canal at  C3-C4 and C6-C7 secondary to posterior disc osteophyte complexes. 3. Emphysematous change in the visualized upper lungs.   Electronically Signed   By: Jacqulynn Cadet M.D.   On: 07/21/2014 23:26   Dg Chest Port 1 View  07/21/2014   CLINICAL DATA:  58 year old male with altered mental status, combative  EXAM: PORTABLE CHEST - 1 VIEW  COMPARISON:  CT scan chest/abdomen/ pelvis 05/27/2013  FINDINGS: Cardiac and mediastinal contours are within normal limits. Slightly low inspiratory volumes, possible expiratory phase timing of the radiograph. No focal airspace consolidation, pleural effusion or pneumothorax. No pulmonary edema. 11 mm nodular opacity projects between the left sixth and seventh ribs. No acute osseous abnormality.  IMPRESSION: 1. No active cardiopulmonary process. 2. Possible 11 mm pulmonary nodule in the left mid to upper lung. Recommend repeat dedicated PA and lateral chest x-ray when the patient is able to breath hold. If the nodular opacity persists, chest CT may become warranted.   Electronically Signed   By: Jacqulynn Cadet M.D.   On: 07/21/2014 23:56    Microbiology: Recent Results (from the past 240 hour(s))  MRSA PCR Screening     Status: None   Collection Time: 07/22/14 12:55 AM  Result Value Ref Range Status   MRSA by PCR NEGATIVE NEGATIVE Final    Comment:        The GeneXpert MRSA Assay (FDA approved for NASAL specimens only), is one component of a comprehensive MRSA colonization surveillance program. It is not intended to diagnose MRSA infection nor to guide or monitor treatment for MRSA infections.      Labs: Basic Metabolic Panel:  Recent Labs Lab 07/21/14 1702 07/21/14 2331 07/22/14 0155 07/23/14 0400 07/24/14 0459 07/26/14 0935  NA 137  --  140 140 141 140  K 3.8  --  3.4* 3.8 3.5* 4.0  CL 97  --  101 103 103 100  CO2 22  --  21 26 23 25   GLUCOSE 84  --  85 93 150* 134*  BUN 35*  --  31* 21 20 14   CREATININE 1.03  --  0.92 0.79 0.76 0.78   CALCIUM 10.1  --  9.5 8.7 9.1 10.1  MG  --  1.3* 1.4*  --   --   --   PHOS  --  2.2* 2.4  --   --   --    Liver Function Tests:  Recent Labs Lab 07/21/14 1702 07/22/14 0155  AST 137* 159*  ALT 43 45  ALKPHOS 76  65  BILITOT 0.9 1.0  PROT 8.4* 7.3  ALBUMIN 4.7 4.2   No results for input(s): LIPASE, AMYLASE in the last 168 hours. No results for input(s): AMMONIA in the last 168 hours. CBC:  Recent Labs Lab 07/21/14 1702 07/22/14 0155  WBC 9.7 8.9  NEUTROABS 6.6  --   HGB 14.1 13.2  HCT 41.1 38.6*  MCV 90.5 89.8  PLT 238 215   Cardiac Enzymes:  Recent Labs Lab 07/22/14 0155 07/22/14 0703 07/22/14 1410  TROPONINI <0.30 <0.30 <0.30   BNP: BNP (last 3 results) No results for input(s): PROBNP in the last 8760 hours. CBG: No results for input(s): GLUCAP in the last 168 hours.  Time coordinating discharge: Over 30 minutes

## 2015-12-06 LAB — ABI

## 2016-01-30 ENCOUNTER — Institutional Professional Consult (permissible substitution): Payer: Self-pay | Admitting: Internal Medicine

## 2016-04-03 ENCOUNTER — Ambulatory Visit (INDEPENDENT_AMBULATORY_CARE_PROVIDER_SITE_OTHER): Payer: Medicare Other | Admitting: Internal Medicine

## 2016-04-03 ENCOUNTER — Encounter: Payer: Self-pay | Admitting: Internal Medicine

## 2016-04-03 ENCOUNTER — Ambulatory Visit (INDEPENDENT_AMBULATORY_CARE_PROVIDER_SITE_OTHER)
Admission: RE | Admit: 2016-04-03 | Discharge: 2016-04-03 | Disposition: A | Discharge status: Home / Self Care | Payer: Medicare Other | Source: Ambulatory Visit | Attending: Internal Medicine | Admitting: Internal Medicine

## 2016-04-03 VITALS — BP 118/80 | HR 75 | Ht 75.0 in | Wt 218.0 lb

## 2016-04-03 DIAGNOSIS — J449 Chronic obstructive pulmonary disease, unspecified: Secondary | ICD-10-CM | POA: Diagnosis not present

## 2016-04-03 MED ORDER — ALBUTEROL SULFATE HFA 108 (90 BASE) MCG/ACT IN AERS: INHALATION_SPRAY | RESPIRATORY_TRACT | 1 refills | Status: AC

## 2016-04-03 NOTE — Progress Notes (Signed)
Subjective:     Patient ID: Dale [name], male   DOB: Nov 05, 1955,     MRN: [id number]  HPI  3 yowm quit smoking summer 2016 with no apparent sequelae with episodic doe first noted around 10/2015 with neg cards w/u by [facility] who referred him to pulmonary clinic 04/03/2016  With ? Copd with nl ex capacity by stress test 11/2015 and no evidence ischemia and PAS of 31 on echo 12/06/15    04/03/2016 1st [facility] office visit/ [facility]  On no resp rx  Chief Complaint  Patient presents with  . Pulmonary Consult    Referred by Neldon Labella, NP.  Pt c/o DOE for the past several months. He was dxed with PNA 4-5 months ago. He states he gets winded chopping wood.   MMRC2 = can't walk a nl pace on a flat grade s sob but does fine slow and flat but more limited by back than breathing.  Only activity that breathing limits is chopping wood but only did so 3 x in last 5 months each time severe sob resolved p 5 m s rx except rest.  No obvious day to day or daytime variability or assoc excess/ purulent sputum or mucus plugs or hemoptysis or cp or chest tightness, subjective wheeze or overt sinus or hb symptoms. No unusual exp hx or h/o childhood pna/ asthma or knowledge of premature birth.  Sleeping ok without nocturnal  or early am exacerbation  of respiratory  c/o's or need for noct saba. Also denies any obvious fluctuation of symptoms with weather or environmental changes or other aggravating or alleviating factors except as outlined above   Current Medications, Allergies, Complete Past Medical History, Past Surgical History, Family History, and Social History were reviewed in [employer] record.  ROS  The following are not active complaints unless bolded sore throat, dysphagia, dental problems, itching, sneezing,  nasal congestion or excess/ purulent secretions, ear ache,   fever, chills, sweats, unintended wt loss, classically pleuritic or  exertional cp,  orthopnea pnd or leg swelling, presyncope, palpitations, abdominal pain, anorexia, nausea, vomiting, diarrhea  or change in bowel or bladder habits, change in stools or urine, dysuria,hematuria,  rash, arthralgias, visual complaints, headache, numbness, weakness or ataxia or problems with walking or coordination,  change in mood/affect or memory.                Review of Systems     Objective:   Physical Exam    amb wm nad  Wt Readings from Last 3 Encounters:  04/03/16 218 lb (98.9 kg)  07/26/14 178 lb 9.2 oz (81 kg)  03/09/13 178 lb 9.2 oz (81 kg)    Vital signs reviewed     HEENT: nl dentition, turbinates, and oropharynx. Nl external ear canals without cough reflex   NECK :  without JVD/Nodes/TM/ nl carotid upstrokes bilaterally   LUNGS: no acc muscle use,  slt barrel contour  chest which is clear to A and P bilaterally without cough on insp or exp maneuvers   CV:  RRR  no s3 or murmur or increase in P2, no edema   ABD:  soft and nontender with end insp hoover's sign  in the supine position. No bruits or organomegaly, bowel sounds nl  MS:  Nl gait/ ext warm without deformities, calf tenderness, cyanosis or clubbing No obvious joint restrictions   SKIN: warm and dry without lesions    NEURO:  alert, approp, nl sensorium with  no motor deficits    CXR PA and Lateral:   04/03/2016 :    I personally reviewed images and agree with radiology impression as follows:   No infiltrate or effusion. Peribronchial thickening may indicate bronchitis. 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     Assessment:

## 2016-04-03 NOTE — Patient Instructions (Signed)
Please remember to go to the   x-ray department downstairs for your tests - we will call you with the results when they are available.   Only use your albuterol as a rescue medication to be used if you can't catch your breath by resting or doing a relaxed purse lip breathing pattern.  - The less you use it, the better it will work when you need it. - Ok to use up to 2 puffs  every 4 hours if you must but call for immediate appointment if use goes up over your usual need - Don't leave home without it !!  (think of it like the spare tire for your car)

## 2016-04-04 ENCOUNTER — Telehealth: Payer: Self-pay | Admitting: Internal Medicine

## 2016-04-04 NOTE — Assessment & Plan Note (Addendum)
Spirometry 04/03/2016  wnl though effort dep portion of f/v loop is abn  -Pt is Group A in terms of symptom/risk and saba first choice with saba/sama alternative  therefore appropriate rx at this point.   - The proper method of use, as well as anticipated side effects, of a metered-dose inhaler are discussed and demonstrated to the patient.     I reviewed the Fletcher curve with the patient that basically indicates  if you quit smoking when your best day FEV1 is still well preserved (as is clearly  the case here)  it is highly unlikely you will progress to severe disease and informed the patient there was  no medication on the market that has proven to alter the curve/ its downward trajectory  or the likelihood of progression of their disease(unlike other chronic medical conditions such as atheroclerosis where we do think we can change the natural hx with risk reducing meds)    Therefore stopping smoking and maintaining abstinence is the most important aspect of care, not choice of inhalers or for that matter, doctors.      Pulmonary f/u can be prn   Total time devoted to counseling  = 35/22mreview case with pt/ discussion of options/alternatives/ personally creating written instructions  in presence of pt  then going over those specific  Instructions directly with the pt including how to use all of the meds but in particular covering each new medication in detail and the difference between the maintenance/automatic meds and the prns using an action plan format for the latter.

## 2016-04-04 NOTE — Progress Notes (Signed)
lmtcb

## 2016-04-04 NOTE — Telephone Encounter (Signed)
Patient notified of CXR results. Nothing further needed.

## 2016-05-13 ENCOUNTER — Encounter: Payer: Self-pay | Admitting: Internal Medicine

## 2016-05-13 DIAGNOSIS — R0600 Dyspnea, unspecified: Secondary | ICD-10-CM | POA: Insufficient documentation

## 2017-05-12 ENCOUNTER — Encounter: Payer: Self-pay | Admitting: Internal Medicine

## 2017-05-12 ENCOUNTER — Ambulatory Visit (INDEPENDENT_AMBULATORY_CARE_PROVIDER_SITE_OTHER): Payer: Medicare Other | Admitting: Internal Medicine

## 2017-05-12 DIAGNOSIS — B182 Chronic viral hepatitis C: Secondary | ICD-10-CM

## 2017-05-12 DIAGNOSIS — Z8619 Personal history of other infectious and parasitic diseases: Secondary | ICD-10-CM | POA: Insufficient documentation

## 2017-05-12 NOTE — Patient Instructions (Signed)
Date 05/12/17  Dear Mr. Shimmel, As discussed in the ID Clinic, your hepatitis C therapy will include highly effective medication(s) for treatment and will vary based on the type of hepatitis C and insurance approval.  Potential medications include:          Harvoni (sofosbuvir 90mg /ledipasvir 400mg ) tablet oral daily          OR     Epclusa (sofosbuvir 400mg /velpatasvir 100mg ) tablet oral daily          OR      Mavyret (glecaprevir 100 mg/pibrentasvir 40 mg): Take 3 tablets oral daily          OR     Zepatier (elbasvir 50 mg/grazoprevir 100 mg) oral daily, +/- ribavirin              Medications are typically for 8 or 12 weeks total ---------------------------------------------------------------- Your HCV Treatment Start Date: You will be notified by our office once the medication is approved and where you can pick it up (or if mailed)   ---------------------------------------------------------------- [facility]:   [facility] [place], [place] [address] Phone: [phone] Hours: Monday to Friday 7:30 am to 6:00 pm   Please always contact your pharmacy at least 3-4 business days before you run out of medications to ensure your next month's medication is ready or 1 week prior to running out if you receive it by mail.  Remember, each prescription is for 28 days. ---------------------------------------------------------------- GENERAL NOTES REGARDING YOUR HEPATITIS C MEDICATION:  Some medications have the following interactions:  - Acid reducing agents such as H2 blockers (ie. Pepcid (famotidine), Zantac (ranitidine), Tagamet (cimetidine), Axid (nizatidine) and proton pump inhibitors (ie. Prilosec (omeprazole), Protonix (pantoprazole), Nexium (esomeprazole), or Aciphex (rabeprazole)). Do not take until you have discussed with a health care provider.    -Antacids that contain magnesium and/or aluminum hydroxide (ie. Milk of Magensia, Rolaids,  Gaviscon, Maalox, Mylanta, an dArthritis Pain Formula).  -Calcium carbonate (calcium supplements or antacids such as Tums, Caltrate, Os-Cal).  -St. John's wort or any products that contain St. John's wort like some herbal supplements  Please inform the office prior to starting any of these medications.  - The common side effects associated with Harvoni include:      1. Fatigue      2. Headache      3. Nausea      4. Diarrhea      5. Insomnia  Please note that this only lists the most common side effects and is NOT a comprehensive list of the potential side effects of these medications. For more information, please review the drug information sheets that come with your medication package from the pharmacy.  ---------------------------------------------------------------- GENERAL HELPFUL HINTS ON HCV THERAPY: 1. Stay well-hydrated. 2. Notify the ID Clinic of any changes in your other over-the-counter/herbal or prescription medications. 3. If you miss a dose of your medication, take the missed dose as soon as you remember. Return to your regular time/dose schedule the next day.  4.  Do not stop taking your medications without first talking with your healthcare provider. 5.  You may take Tylenol (acetaminophen), as long as the dose is less than 2000 mg (OR no more than 4 tablets of the Tylenol Extra Strengths 500mg  tablet) in 24 hours. 6.  You will see our pharmacist-specialist within the first 2 weeks of starting your medication to monitor for any possible side effects. 7.  You will have labs once during treatment, soon  after treatment completion and one final lab 6 months after treatment completion to verify the virus is out of your system.  Scharlene Gloss, [place] for [place], [place]  [id number] [phone]

## 2017-05-12 NOTE — Progress Notes (Signed)
[facility] for Infectious Disease   CC: consideration for treatment for chronic hepatitis C  HPI:  +Dale [name] is a 61 y.o. male who presents for initial evaluation and management of chronic hepatitis C.  Patient tested positive in just recently by his new PCP with Ab. Hepatitis C-associated risk factors present are: none. Patient denies history of blood transfusion, intranasal drug use, IV drug abuse. Patient has had other studies performed. Results: hepatitis C RNA by PCR, result: positive in 2015 though the patient was unaware of it. Patient has not had prior treatment for Hepatitis C. Patient does not have a past history of liver disease. Patient does not have a family history of liver disease. Patient does not  have associated signs or symptoms related to liver disease.  Labs reviewed and confirm chronic hepatitis C with a positive viral load in 2015.   Records reviewed from Epic as above,  Tested positive in 2015.      Patient does not have documented immunity to Hepatitis A. Patient does not have documented immunity to Hepatitis B.    Review of Systems:  Constitutional: negative for fatigue, malaise and anorexia Gastrointestinal: negative for diarrhea Integument/breast: negative for rash All other systems reviewed and are negative       Past Medical History:  Diagnosis Date  . Acute renal failure ([place]) 03/08/2013  . Back pain     Prior to Admission medications   Medication Sig Start Date End Date Taking? Authorizing Provider  albuterol (PROAIR HFA) 108 (90 Base) MCG/ACT inhaler 2 puffs every 4 hours as needed only  if your can't catch your breath 04/03/16  Yes Tanda Rockers, MD  ALPRAZolam Duanne Moron) 1 MG tablet Take 1 mg by mouth 2 (two) times daily. 03/06/16  Yes [provider]  cyclobenzaprine (FLEXERIL) 10 MG tablet Take 10 mg by mouth 2 (two) times daily as needed. For muscle spasms.   Yes [provider]  oxymorphone (OPANA ER) 40 MG 12 hr  tablet Take 40 mg by mouth every 12 (twelve) hours.   Yes [provider]  DULoxetine (CYMBALTA) 60 MG capsule Take 60 mg by mouth daily. 07/13/14   [provider]  DULoxetine (CYMBALTA) 60 MG capsule duloxetine 60 mg capsule,delayed release    [provider]  lubiprostone (AMITIZA) 24 MCG capsule Amitiza 24 mcg capsule    [provider]  meloxicam (MOBIC) 15 MG tablet meloxicam 15 mg tablet    [provider]    Allergies  Allergen Reactions  . Penicillins Hives    Social History  Substance Use Topics  . Smoking status: Current Every Day Smoker    Packs/day: 0.25    Years: 30.00    Types: E-cigarettes    Last attempt to quit: 08/25/2014  . Smokeless tobacco: Never Used  . Alcohol use Yes     Comment: occasionally    Family History  Problem Relation Age of Onset  . Family history unknown: Yes      Objective:  Constitutional: in no apparent distress and alert,  Vitals:   05/12/17 1039  BP: 136/89  Pulse: 72  Temp: 97.9 F (36.6 C)   Eyes: anicteric Cardiovascular: Cor RRR Respiratory: CTA B; normal respiratory effort Gastrointestinal: Bowel sounds are normal, liver is not enlarged, spleen is not enlarged Musculoskeletal: no pedal edema noted Skin: negatives: no rash; no porphyria cutanea tarda Lymphatic: no cervical lymphadenopathy   Laboratory Genotype: No results found for: HCVGENOTYPE HCV viral load:  Lab Results  Component Value Date   HCVQUANT 221,718 (H) 07/22/2014   Lab Results  Component Value Date   WBC 8.9 07/22/2014   HGB 13.2 07/22/2014   HCT 38.6 (L) 07/22/2014   MCV 89.8 07/22/2014   PLT 215 07/22/2014    Lab Results  Component Value Date   CREATININE 0.78 07/26/2014   BUN 14 07/26/2014   NA 140 07/26/2014   K 4.0 07/26/2014   CL 100 07/26/2014   CO2 25 07/26/2014    Lab Results  Component Value Date   ALT 45 07/22/2014   AST 159 (H) 07/22/2014   ALKPHOS 65 07/22/2014     Labs and  history reviewed and show CHILD-PUGH unknown  5-6 points: Child class A 7-9 points: Child class B 10-15 points: Child class C  Lab Results  Component Value Date   INR 1.07 05/27/2013   BILITOT 1.0 07/22/2014   ALBUMIN 4.2 07/22/2014     Assessment: New Patient with Chronic Hepatitis C genotype unknown, untreated.  I discussed with the patient the lab findings that confirm chronic hepatitis C as well as the natural history and progression of disease including about 30% of people who develop cirrhosis of the liver if left untreated and once cirrhosis is established there is a 2-7% risk per year of liver cancer and liver failure.  I discussed the importance of treatment and benefits in reducing the risk, even if significant liver fibrosis exists.   Plan: 1) Patient counseled extensively on limiting acetaminophen to no more than 2 grams daily, avoidance of alcohol. 2) Transmission discussed with patient including sexual transmission, sharing razors and toothbrush.   3) Will need referral to gastroenterology if concern for cirrhosis 4) Will need referral for substance abuse counseling: No.; Further work up to include urine drug screen  No. 5) Will prescribe appropriate medication based on genotype and coverage  6) Hepatitis A and B titers, thought the patient refuses all vaccines 7) Pneumovax vaccine refused 9) Further work up to include liver staging with elastography 10) will follow up after starting medication

## 2017-05-14 LAB — LIVER FIBROSIS, FIBROTEST-ACTITEST
ALPHA-2-MACROGLOBULIN: 320 mg/dL — AB (ref 106–279)
ALT: 16 U/L (ref 9–46)
APOLIPOPROTEIN A1: 142 mg/dL (ref 94–176)
BILIRUBIN: 0.3 mg/dL (ref 0.2–1.2)
Fibrosis Score: 0.35
GGT: 14 U/L (ref 3–70)
HAPTOGLOBIN: 117 mg/dL (ref 43–212)
NECROINFLAMMAT ACT SCORE: 0.06
Reference ID: 2115906

## 2017-05-15 ENCOUNTER — Other Ambulatory Visit: Payer: Self-pay | Admitting: Pharmacist

## 2017-05-15 LAB — HEPATITIS B CORE ANTIBODY, TOTAL: HEP B C TOTAL AB: NONREACTIVE

## 2017-05-15 LAB — HEPATITIS C GENOTYPE

## 2017-05-15 LAB — PROTIME-INR
INR: 1
PROTHROMBIN TIME: 10.9 s (ref 9.0–11.5)

## 2017-05-15 LAB — HEPATITIS A ANTIBODY, TOTAL: Hepatitis A AB,Total: NONREACTIVE

## 2017-05-15 LAB — HEPATITIS C RNA QUANTITATIVE
HCV QUANT LOG: 6.14 {Log_IU}/mL — AB
HCV RNA, PCR, QN: 1370000 [IU]/mL — AB

## 2017-05-15 LAB — HEPATITIS B SURFACE ANTIGEN: Hepatitis B Surface Ag: NONREACTIVE

## 2017-05-15 LAB — HEPATITIS B SURFACE ANTIBODY,QUALITATIVE: Hep B S Ab: NONREACTIVE

## 2017-05-18 ENCOUNTER — Other Ambulatory Visit: Payer: Self-pay | Admitting: Internal Medicine

## 2017-05-18 MED ORDER — LEDIPASVIR-SOFOSBUVIR 90-400 MG PO TABS: 1.0000 | ORAL_TABLET | Freq: Every day | ORAL | 2 refills | Status: DC

## 2017-05-22 ENCOUNTER — Other Ambulatory Visit: Payer: Self-pay | Admitting: Pharmacist

## 2017-05-22 ENCOUNTER — Encounter: Payer: Self-pay | Admitting: Pharmacy Technician

## 2017-05-22 MED FILL — HARVONI 90-400 MG TABLET: 90-400 | 28 days supply | Qty: 28 | Fill #0

## 2017-05-28 ENCOUNTER — Ambulatory Visit (HOSPITAL_COMMUNITY): Payer: Medicare Other

## 2017-06-12 ENCOUNTER — Ambulatory Visit (HOSPITAL_COMMUNITY)
Admission: RE | Admit: 2017-06-12 | Discharge: 2017-06-12 | Disposition: A | Discharge status: Home / Self Care | Payer: Medicare Other | Source: Ambulatory Visit | Attending: Internal Medicine | Admitting: Internal Medicine

## 2017-06-12 DIAGNOSIS — N281 Cyst of kidney, acquired: Secondary | ICD-10-CM | POA: Diagnosis not present

## 2017-06-12 DIAGNOSIS — B182 Chronic viral hepatitis C: Secondary | ICD-10-CM | POA: Diagnosis not present

## 2017-06-15 MED FILL — HARVONI 90-400 MG TABLET: 90-400 | 28 days supply | Qty: 28 | Fill #1

## 2017-06-18 ENCOUNTER — Ambulatory Visit (INDEPENDENT_AMBULATORY_CARE_PROVIDER_SITE_OTHER): Payer: Medicare Other | Admitting: Pharmacist

## 2017-06-18 DIAGNOSIS — B182 Chronic viral hepatitis C: Secondary | ICD-10-CM

## 2017-06-18 NOTE — Progress Notes (Signed)
HPI: Dale [name] is a 61 y.o. male who presents to the [facility] for Hep C follow-up.  He has genotype 1a, F1/F2, and started 8 weeks of Harvoni ~10/1.  Lab Results  Component Value Date   HCVGENOTYPE 1a 05/12/2017    Allergies: Allergies  Allergen Reactions  . Penicillins Hives    Past Medical History: Past Medical History:  Diagnosis Date  . Acute renal failure ([place]) 03/08/2013  . Back pain     Social History: Social History   Social History  . Marital status: Single    Spouse name: N/A  . Number of children: N/A  . Years of education: N/A   Social History Main Topics  . Smoking status: Current Every Day Smoker    Packs/day: 0.25    Years: 30.00    Types: E-cigarettes    Last attempt to quit: 08/25/2014  . Smokeless tobacco: Never Used  . Alcohol use Yes     Comment: occasionally  . Drug use: No     Comment: hx cocaine years ago  . Sexual activity: Yes    Partners: Female   Other Topics Concern  . Not on file   Social History Narrative  . No narrative on file    Labs: Hep B S Ab (no units)  Date Value  05/12/2017 NON-REACTIVE   Hepatitis B Surface Ag (no units)  Date Value  05/12/2017 NON-REACTIVE   HCV Ab (no units)  Date Value  07/22/2014 Reactive (A)    Lab Results  Component Value Date   HCVGENOTYPE 1a 05/12/2017    Hepatitis C RNA quantitative Latest Ref Rng & Units 05/12/2017 07/22/2014  HCV Quantitative <15 IU/mL - 221,718(H)  HCV Quantitative Log NOT DETECT Log IU/mL 6.14(H) 5.35(H)    AST (U/L)  Date Value  07/22/2014 159 (H)  07/21/2014 137 (H)  05/27/2013 47 (H)   ALT (U/L)  Date Value  05/12/2017 16  07/22/2014 45  07/21/2014 43  05/27/2013 29   INR (no units)  Date Value  05/12/2017 1.0  05/27/2013 1.07    CrCl: CrCl cannot be calculated (Patient's most recent lab result is older than the maximum 21 days allowed.).  Fibrosis Score: F0 as assessed by ARFI   Child-Pugh Score: A  Previous  Treatment Regimen: None  Assessment: Dale [name] is here today to follow-up for his Hep C infection. He has genotype 1a and started 8 weeks of Harvoni around 10/1.  He is able to get 8 weeks due to his F0 status, not being co-infected with HIV, caucasian, and baseline HCV RNA < 6 million. He is not having any issues whatsoever with Harvoni - no nausea, vomiting, diarrhea, fatigue, or headaches. He is almost done with his 1st month.  He already has his 2nd month from [facility].  He does not think he has missed any doses since starting.  I reiterated the importance of staying compliant while on Harvoni.  Will get a HCV RNA today and bring him back in a month for EOT appt.   Plans: - Harvoni x 8 weeks - HCV RNA today - F/u with me again 12/5 at [place]. Dale [name], PharmD, [name] 06/18/2017, 3:16 PM

## 2017-06-20 LAB — HEPATITIS C RNA QUANTITATIVE
HCV QUANT LOG: NOT DETECTED {Log_IU}/mL
HCV RNA, PCR, QN: NOT DETECTED [IU]/mL

## 2017-07-29 ENCOUNTER — Ambulatory Visit (INDEPENDENT_AMBULATORY_CARE_PROVIDER_SITE_OTHER): Payer: Medicare Other | Admitting: Pharmacist Clinician (PhC)/ Clinical Pharmacy Specialist

## 2017-07-29 DIAGNOSIS — B182 Chronic viral hepatitis C: Secondary | ICD-10-CM

## 2017-07-29 LAB — COMPLETE METABOLIC PANEL WITH GFR
AG RATIO: 1.6 (calc) (ref 1.0–2.5)
ALKALINE PHOSPHATASE (APISO): 68 U/L (ref 40–115)
ALT: 18 U/L (ref 9–46)
AST: 24 U/L (ref 10–35)
Albumin: 4.3 g/dL (ref 3.6–5.1)
BUN: 17 mg/dL (ref 7–25)
CALCIUM: 9.3 mg/dL (ref 8.6–10.3)
CO2: 31 mmol/L (ref 20–32)
Chloride: 100 mmol/L (ref 98–110)
Creat: 0.94 mg/dL (ref 0.70–1.25)
GFR, EST NON AFRICAN AMERICAN: 87 mL/min/{1.73_m2} (ref >=60)
GFR, Est African American: 101 mL/min/{1.73_m2} (ref >=60)
GLOBULIN: 2.7 g/dL (ref 1.9–3.7)
GLUCOSE: 105 mg/dL — AB (ref 65–99)
POTASSIUM: 4.3 mmol/L (ref 3.5–5.3)
SODIUM: 137 mmol/L (ref 135–146)
Total Bilirubin: 0.5 mg/dL (ref 0.2–1.2)
Total Protein: 7 g/dL (ref 6.1–8.1)

## 2017-07-29 NOTE — Progress Notes (Signed)
HPI: Cartel Mauss is a 61 y.o. male who is here for his EOT hep C visit.   Lab Results  Component Value Date   HCVGENOTYPE 1a 05/12/2017    Allergies: Allergies  Allergen Reactions  . Penicillins Hives    Vitals:    Past Medical History: Past Medical History:  Diagnosis Date  . Acute renal failure ([place]) 03/08/2013  . Back pain     Social History: Social History   Socioeconomic History  . Marital status: Single    Spouse name: Not on file  . Number of children: Not on file  . Years of education: Not on file  . Highest education level: Not on file  Social Needs  . Financial resource strain: Not on file  . Food insecurity - worry: Not on file  . Food insecurity - inability: Not on file  . Transportation needs - medical: Not on file  . Transportation needs - non-medical: Not on file  Occupational History  . Not on file  Tobacco Use  . Smoking status: Current Every Day Smoker    Packs/day: 0.25    Years: 30.00    Pack years: 7.50    Types: E-cigarettes    Last attempt to quit: 08/25/2014    Years since quitting: 2.9  . Smokeless tobacco: Never Used  Substance and Sexual Activity  . Alcohol use: Yes    Comment: occasionally  . Drug use: No    Comment: hx cocaine years ago  . Sexual activity: Yes    Partners: Female  Other Topics Concern  . Not on file  Social History Narrative  . Not on file    Labs: Hep B S Ab (no units)  Date Value  05/12/2017 NON-REACTIVE   Hepatitis B Surface Ag (no units)  Date Value  05/12/2017 NON-REACTIVE   HCV Ab (no units)  Date Value  07/22/2014 Reactive (A)    Lab Results  Component Value Date   HCVGENOTYPE 1a 05/12/2017    Hepatitis C RNA quantitative Latest Ref Rng & Units 06/18/2017 05/12/2017 07/22/2014  HCV Quantitative <15 IU/mL - - 221,718(H)  HCV Quantitative Log NOT DETECT Log IU/mL <1.18 NOT DETECTED 6.14(H) 5.35(H)    AST (U/L)  Date Value  07/22/2014 159 (H)  07/21/2014 137 (H)  05/27/2013 47 (H)    ALT (U/L)  Date Value  05/12/2017 16  07/22/2014 45  07/21/2014 43  05/27/2013 29   INR (no units)  Date Value  05/12/2017 1.0  05/27/2013 1.07    CrCl: CrCl cannot be calculated (Patient's most recent lab result is older than the maximum 21 days allowed.).  Fibrosis Score: F0/1 as assessed by ARFI  Child-Pugh Score: Class A  Previous Treatment Regimen: None  Assessment: Salik finished his 8 wks of Harvoni about 3 days ago for his 1a hepatitis C. He did very well on the med and very grateful. His first VL was undetectable and I showed him the result. He is hep A and B ab neg. Offered him for the hep A/B, PNA, flu vaccine today but he refused all three. Educated him on hep C again. He will come back in 3 months for the [facility] labs and the cure visit with pharmacy.   Recommendations:  EOT VL today SVR12 in 3 months Cure with visit with pharmacy  [facility], [place].D., BCPS, AAHIVP Clinical Infectious [place] for Infectious Disease 07/29/2017, 9:47 AM

## 2017-07-29 NOTE — Patient Instructions (Signed)
Come back for the cure lab in 3 months then the cure visit with [place]

## 2017-07-31 LAB — HEPATITIS C RNA QUANTITATIVE
HCV QUANT LOG: NOT DETECTED {Log_IU}/mL
HCV RNA, PCR, QN: NOT DETECTED [IU]/mL

## 2017-08-09 ENCOUNTER — Other Ambulatory Visit: Payer: Self-pay

## 2017-08-09 ENCOUNTER — Emergency Department (HOSPITAL_COMMUNITY)
Admission: EM | Admit: 2017-08-09 | Discharge: 2017-08-09 | Disposition: A | Discharge status: Home / Self Care | Payer: Medicare Other | Attending: Emergency Medicine | Admitting: Emergency Medicine

## 2017-08-09 ENCOUNTER — Encounter (HOSPITAL_COMMUNITY): Payer: Self-pay | Admitting: *Deleted

## 2017-08-09 DIAGNOSIS — Z880 Allergy status to penicillin: Secondary | ICD-10-CM | POA: Diagnosis not present

## 2017-08-09 DIAGNOSIS — R531 Weakness: Secondary | ICD-10-CM | POA: Diagnosis present

## 2017-08-09 DIAGNOSIS — F17290 Nicotine dependence, other tobacco product, uncomplicated: Secondary | ICD-10-CM | POA: Insufficient documentation

## 2017-08-09 DIAGNOSIS — J449 Chronic obstructive pulmonary disease, unspecified: Secondary | ICD-10-CM | POA: Insufficient documentation

## 2017-08-09 DIAGNOSIS — Z79899 Other long term (current) drug therapy: Secondary | ICD-10-CM | POA: Insufficient documentation

## 2017-08-09 DIAGNOSIS — R4182 Altered mental status, unspecified: Secondary | ICD-10-CM | POA: Diagnosis not present

## 2017-08-09 LAB — COMPREHENSIVE METABOLIC PANEL
ALBUMIN: 4.3 g/dL (ref 3.5–5.0)
ALT: 16 U/L — ABNORMAL LOW (ref 17–63)
ANION GAP: 8 (ref 5–15)
AST: 25 U/L (ref 15–41)
Alkaline Phosphatase: 69 U/L (ref 38–126)
BUN: 18 mg/dL (ref 6–20)
CHLORIDE: 103 mmol/L (ref 101–111)
CO2: 27 mmol/L (ref 22–32)
Calcium: 9.6 mg/dL (ref 8.9–10.3)
Creatinine, Ser: 0.9 mg/dL (ref 0.61–1.24)
GFR calc Af Amer: >60 mL/min (ref >60)
GFR calc non Af Amer: >60 mL/min (ref >60)
GLUCOSE: 106 mg/dL — AB (ref 65–99)
POTASSIUM: 3.7 mmol/L (ref 3.5–5.1)
SODIUM: 138 mmol/L (ref 135–145)
Total Bilirubin: 0.4 mg/dL (ref 0.3–1.2)
Total Protein: 7.5 g/dL (ref 6.5–8.1)

## 2017-08-09 LAB — ACETAMINOPHEN LEVEL

## 2017-08-09 LAB — CBC
HEMATOCRIT: 41 % (ref 39.0–52.0)
Hemoglobin: 14.1 g/dL (ref 13.0–17.0)
MCH: 31.7 pg (ref 26.0–34.0)
MCHC: 34.4 g/dL (ref 30.0–36.0)
MCV: 92.1 fL (ref 78.0–100.0)
PLATELETS: 227 10*3/uL (ref 150–400)
RBC: 4.45 MIL/uL (ref 4.22–5.81)
RDW: 12.9 % (ref 11.5–15.5)
WBC: 5.9 10*3/uL (ref 4.0–10.5)

## 2017-08-09 LAB — CBG MONITORING, ED: Glucose-Capillary: 88 mg/dL (ref 65–99)

## 2017-08-09 LAB — SALICYLATE LEVEL: Salicylate Lvl: <7 mg/dL (ref 2.8–30.0)

## 2017-08-09 LAB — ETHANOL

## 2017-08-09 NOTE — ED Provider Notes (Signed)
[facility] EMERGENCY DEPARTMENT Provider Note   CSN: [id number] Arrival date & time: 08/09/17  1516     History   Chief Complaint Chief Complaint  Patient presents with  . Drug Overdose    HPI Dale [name] is a 61 y.o. male.  HPI Patient is under chronic pain management and takes oxymorphone 3 times a day.  He also takes as needed Xanax.  Patient denies that he took his medications in excess.  His sister reports that he did have his medications cut back by his pain care provider.  Patient reports that earlier today he just "did not feel right".  He reports his limbs felt shaky and weak.  He has a hard time quantifying much beyond that.  He does not endorse localizing or lateralizing weakness.  He reports he felt dizzy.  He went to his sister's and told her he needed to go to the hospital.  I asked the patient if he was concerned that he had a stroke or other specific diagnosis that he had considered as being the problem which he denied.  He also denied overuse of his medication or possible withdrawal symptoms. Past Medical History:  Diagnosis Date  . Acute renal failure ([place]) 03/08/2013  . Back pain     Patient Active Problem List   Diagnosis Date Noted  . Chronic hepatitis C without hepatic coma ([place]) 05/12/2017  . Dyspnea 05/13/2016  . 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 Palliative care encounter   . Chronic pain syndrome   . Acute encephalopathy 07/22/2014  . 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 Delirium tremens ([place]) 07/21/2014  . Malnutrition of moderate degree ([place]) 03/10/2013    Past Surgical History:  Procedure Laterality Date  . LEG SURGERY     left femur fx       Home Medications    Prior to Admission medications   Medication Sig Start Date End Date Taking? Authorizing Provider  albuterol (PROAIR HFA) 108 (90 Base) MCG/ACT inhaler 2 puffs every 4 hours as needed only  if your can't catch your breath 04/03/16  Yes Tanda Rockers, MD  ALPRAZolam Duanne Moron) 1 MG tablet Take 1 mg by mouth 2 (two) times daily. 03/06/16  Yes [provider]  DULoxetine (CYMBALTA) 60 MG capsule Take 60 mg by mouth daily. 07/13/14   [provider]  DULoxetine (CYMBALTA) 60 MG capsule duloxetine 60 mg capsule,delayed release    [provider]  meloxicam (MOBIC) 15 MG tablet meloxicam 15 mg tablet    [provider]  oxymorphone (OPANA ER) 40 MG 12 hr tablet Take 40 mg by mouth every 12 (twelve) hours.    [provider]    Family History Family History  Family history unknown: Yes    Social History Social History   Tobacco Use  . Smoking status: Current Every Day Smoker    Packs/day: 0.25    Years: 30.00    Pack years: 7.50    Types: E-cigarettes    Last attempt to quit: 08/25/2014    Years since quitting: 2.9  . Smokeless tobacco: Never Used  Substance Use Topics  . Alcohol use: Yes    Comment: occasionally  . Drug use: No    Comment: hx cocaine years ago     Allergies   Penicillin g and Penicillins   Review of Systems Review of Systems  10 Systems reviewed and are negative for acute change except as noted in the  HPI.  Physical Exam Updated Vital Signs BP (!) 148/94   Pulse 83   Temp (!) 97.5 F (36.4 C) (Oral)   Resp 17   Ht 6\' 3"  (1.905 m)   Wt 99.8 kg (220 lb)   SpO2 100%   BMI 27.50 kg/m   Physical Exam  Constitutional: He is oriented to person, place, and time. He appears well-developed and well-nourished.  Patient is slightly somnolent in and overmedicated appearing way.  However awakens to appropriately answer questions and follow commands to perform arm physical exam.  He is nontoxic.  Generally good physical condition for age.  No respiratory distress.  HENT:  Head: Normocephalic and atraumatic.  Nose: Nose normal.  Mouth/Throat: Oropharynx is clear and moist.  Eyes: Conjunctivae and EOM are normal. Pupils are equal, round, and reactive to  light.  Neck: Neck supple.  Cardiovascular: Normal rate, regular rhythm, normal heart sounds and intact distal pulses.  No murmur heard. Pulmonary/Chest: Effort normal and breath sounds normal. No respiratory distress.  Abdominal: Soft. There is no tenderness.  Musculoskeletal: Normal range of motion. He exhibits no edema, tenderness or deformity.  Neurological: He is alert and oriented to person, place, and time. No cranial nerve deficit. He exhibits normal muscle tone. Coordination normal.  Cranial nerves II through XII are intact.  Although patient has a drowsy appearance, his cognitive function is intact.  Speech is normal contact for situation.  Upper motor strength 5\5 for grip extension and resistance against flexion.  Extremity strength 5\5 for elevation hold off of the bed.  No deficit to light touch.  Skin: Skin is warm and dry.  Psychiatric: He has a normal mood and affect.  Nursing note and vitals reviewed.    ED Treatments / Results  Labs (all labs ordered are listed, but only abnormal results are displayed) Labs Reviewed  COMPREHENSIVE METABOLIC PANEL - Abnormal; Notable for the following components:      Result Value   Glucose, Bld 106 (*)    ALT 16 (*)    All other components within normal limits  ACETAMINOPHEN LEVEL - Abnormal; Notable for the following components:   Acetaminophen (Tylenol), Serum <10 (*)    All other components within normal limits  ETHANOL  SALICYLATE LEVEL  CBC  RAPID URINE DRUG SCREEN, HOSP PERFORMED  CBG MONITORING, ED    EKG  EKG Interpretation  Date/Time:  Sunday August 09 2017 15:49:32 EST Ventricular Rate:  91 PR Interval:  168 QRS Duration: 76 QT Interval:  378 QTC Calculation: 464 R Axis:   47 Text Interpretation:  Sinus rhythm with Premature atrial complexes Otherwise normal ECG no sig change from old Confirmed by Charlesetta Shanks [phone]) on 08/09/2017 4:41:12 PM       Radiology No results  found.  Procedures Procedures (including critical care time)  Medications Ordered in ED Medications - No data to display   Initial Impression / Assessment and Plan / ED Course  I have reviewed the triage vital signs and the nursing notes.  Pertinent labs & imaging results that were available during my care of the patient were reviewed by me and considered in my medical decision making (see chart for details).     Final Clinical Impressions(s) / ED Diagnoses   Final diagnoses:  Altered mental status, unspecified altered mental status type  Patient's sister was present during initial portion of evaluation.  She assisted in history.  Her husband then arrived as well.  We were discussing proceeding with MRI to  rule out CVA.  Was difficult to determine based on the patient's history of ostensible mental status change and perception of some type of motor dysfunction, whether or not this is medication related or fundamentally neurologic in etiology.  Notably however his neurologic exam is nonfocal.  Patient then determined he wanted to sign out AMA.  He refused further diagnostic evaluation.  Patient's sister was frustrated with him for his decision to sign out AMA and indicated she has been through this before and will not spend time negotiating with him.  Patient reports he understands risk of subsequent stroke being debilitating with permanent disability or death.  Patient will leave with his sister's husband.  They are advised of return precautions and immediate return for any concerns.  ED Discharge Orders    None       Charlesetta Shanks, MD 08/09/17 [phone]

## 2017-08-09 NOTE — ED Triage Notes (Signed)
The pt does not answer

## 2017-08-09 NOTE — ED Triage Notes (Signed)
The pt is c/o of body aches his speech is slurred  His sister came in during triage  She reports that  Her brother came to her house stating the he needed to go to the hospital.  She reports that the pt takes dilaudid and xanax for chronic back pain  She thinks he has taken too much medicine.  Speech is lsurred slow he can hardly stay awake  He cannot give any history

## 2017-10-27 ENCOUNTER — Other Ambulatory Visit: Payer: Medicare Other

## 2017-10-27 DIAGNOSIS — B182 Chronic viral hepatitis C: Secondary | ICD-10-CM

## 2017-10-27 LAB — COMPLETE METABOLIC PANEL WITH GFR
AG Ratio: 1.7 (calc) (ref 1.0–2.5)
ALKALINE PHOSPHATASE (APISO): 55 U/L (ref 40–115)
ALT: 8 U/L — AB (ref 9–46)
AST: 14 U/L (ref 10–35)
Albumin: 4.2 g/dL (ref 3.6–5.1)
BUN: 13 mg/dL (ref 7–25)
CALCIUM: 9.4 mg/dL (ref 8.6–10.3)
CO2: 31 mmol/L (ref 20–32)
CREATININE: 0.93 mg/dL (ref 0.70–1.25)
Chloride: 103 mmol/L (ref 98–110)
GFR, EST NON AFRICAN AMERICAN: 88 mL/min/{1.73_m2} (ref >=60)
GFR, Est African American: 102 mL/min/{1.73_m2} (ref >=60)
GLOBULIN: 2.5 g/dL (ref 1.9–3.7)
GLUCOSE: 108 mg/dL — AB (ref 65–99)
Potassium: 4.2 mmol/L (ref 3.5–5.3)
SODIUM: 140 mmol/L (ref 135–146)
Total Bilirubin: 0.4 mg/dL (ref 0.2–1.2)
Total Protein: 6.7 g/dL (ref 6.1–8.1)

## 2017-10-29 LAB — HEPATITIS C RNA QUANTITATIVE
HCV QUANT LOG: NOT DETECTED {Log_IU}/mL
HCV RNA, PCR, QN: <15 IU/mL

## 2017-11-03 ENCOUNTER — Ambulatory Visit (INDEPENDENT_AMBULATORY_CARE_PROVIDER_SITE_OTHER): Payer: Medicare Other | Admitting: Pharmacist Clinician (PhC)/ Clinical Pharmacy Specialist

## 2017-11-03 DIAGNOSIS — B182 Chronic viral hepatitis C: Secondary | ICD-10-CM

## 2017-11-03 NOTE — Progress Notes (Signed)
HPI: Dale [name] is a 62 y.o. male who is here for his cure visit with pharmacy  Lab Results  Component Value Date   HCVGENOTYPE 1a 05/12/2017    Allergies: Allergies  Allergen Reactions  . Penicillin G   . Penicillins Swelling    Has patient had a PCN reaction causing immediate rash, facial/tongue/throat swelling, SOB or lightheadedness with hypotension: No Has patient had a PCN reaction causing severe rash involving mucus membranes or skin necrosis: No Has patient had a PCN reaction that required hospitalization: No Has patient had a PCN reaction occurring within the last 10 years: No If all of the above answers are "NO", then may proceed with Cephalosporin use.    Vitals:    Past Medical History: Past Medical History:  Diagnosis Date  . Acute renal failure ([place]) 03/08/2013  . Back pain     Social History: Social History   Socioeconomic History  . Marital status: Single    Spouse name: Not on file  . Number of children: Not on file  . Years of education: Not on file  . Highest education level: Not on file  Social Needs  . Financial resource strain: Not on file  . Food insecurity - worry: Not on file  . Food insecurity - inability: Not on file  . Transportation needs - medical: Not on file  . Transportation needs - non-medical: Not on file  Occupational History  . Not on file  Tobacco Use  . Smoking status: Current Every Day Smoker    Packs/day: 0.25    Years: 30.00    Pack years: 7.50    Types: E-cigarettes    Last attempt to quit: 08/25/2014    Years since quitting: 3.1  . Smokeless tobacco: Never Used  Substance and Sexual Activity  . Alcohol use: Yes    Comment: occasionally  . Drug use: No    Comment: hx cocaine years ago  . Sexual activity: Yes    Partners: Female  Other Topics Concern  . Not on file  Social History Narrative  . Not on file    Labs: Hep B S Ab (no units)  Date Value  05/12/2017 NON-REACTIVE   Hepatitis B Surface Ag (no  units)  Date Value  05/12/2017 NON-REACTIVE   HCV Ab (no units)  Date Value  07/22/2014 Reactive (A)    Lab Results  Component Value Date   HCVGENOTYPE 1a 05/12/2017    Hepatitis C RNA quantitative Latest Ref Rng & Units 10/27/2017 07/29/2017 06/18/2017 05/12/2017 07/22/2014  HCV Quantitative <15 IU/mL - - - - 221,718(H)  HCV Quantitative Log NOT DETECT Log IU/mL <1.18 NOT DETECTED <1.18 NOT DETECTED <1.18 NOT DETECTED 6.14(H) 5.35(H)    AST (U/L)  Date Value  10/27/2017 14  08/09/2017 25  07/29/2017 24   ALT (U/L)  Date Value  10/27/2017 8 (L)  08/09/2017 16 (L)  07/29/2017 18  05/12/2017 16   INR (no units)  Date Value  05/12/2017 1.0  05/27/2013 1.07    CrCl: CrCl cannot be calculated (Unknown ideal weight.).  Fibrosis Score: F0/1 as assessed by ARFI  Child-Pugh Score: Class A  Previous Treatment Regimen: None  Assessment: Clearance came back for his SVR12 last week and it was neg. He is now cured of his hep C. Went over the risk factors to him again. Explained to him that he has very minimal fibrosis. He was very excited to hear the news and greatly appreciative of our care for his hep  C. He doesn't need to f/u with [place] anymore. He refused all vaccines at the last visit.   Recommendations:  Cured of hep C  [employer], Pharm.D., BCPS, AAHIVP Clinical Infectious [place] for Infectious Disease 11/03/2017, 8:43 AM

## 2018-01-22 ENCOUNTER — Other Ambulatory Visit: Payer: Self-pay

## 2018-01-22 ENCOUNTER — Observation Stay (HOSPITAL_COMMUNITY)
Admission: EM | Admit: 2018-01-22 | Discharge: 2018-01-23 | Disposition: A | Discharge status: Home / Self Care | Payer: Medicare Other | Attending: Family Medicine | Admitting: Family Medicine

## 2018-01-22 ENCOUNTER — Encounter (HOSPITAL_COMMUNITY): Payer: Self-pay

## 2018-01-22 DIAGNOSIS — Z880 Allergy status to penicillin: Secondary | ICD-10-CM | POA: Insufficient documentation

## 2018-01-22 DIAGNOSIS — T796XXA Traumatic ischemia of muscle, initial encounter: Secondary | ICD-10-CM

## 2018-01-22 DIAGNOSIS — F13239 Sedative, hypnotic or anxiolytic dependence with withdrawal, unspecified: Secondary | ICD-10-CM | POA: Diagnosis present

## 2018-01-22 DIAGNOSIS — F13931 Sedative, hypnotic or anxiolytic use, unspecified with withdrawal delirium: Secondary | ICD-10-CM

## 2018-01-22 DIAGNOSIS — F410 Panic disorder [episodic paroxysmal anxiety] without agoraphobia: Secondary | ICD-10-CM | POA: Diagnosis not present

## 2018-01-22 DIAGNOSIS — B182 Chronic viral hepatitis C: Secondary | ICD-10-CM | POA: Diagnosis not present

## 2018-01-22 DIAGNOSIS — Z79899 Other long term (current) drug therapy: Secondary | ICD-10-CM | POA: Diagnosis not present

## 2018-01-22 DIAGNOSIS — F17290 Nicotine dependence, other tobacco product, uncomplicated: Secondary | ICD-10-CM | POA: Diagnosis not present

## 2018-01-22 DIAGNOSIS — Z66 Do not resuscitate: Secondary | ICD-10-CM | POA: Diagnosis not present

## 2018-01-22 DIAGNOSIS — G894 Chronic pain syndrome: Secondary | ICD-10-CM | POA: Insufficient documentation

## 2018-01-22 DIAGNOSIS — M6282 Rhabdomyolysis: Secondary | ICD-10-CM | POA: Diagnosis not present

## 2018-01-22 DIAGNOSIS — F13232 Sedative, hypnotic or anxiolytic dependence with withdrawal with perceptual disturbance: Secondary | ICD-10-CM

## 2018-01-22 DIAGNOSIS — R443 Hallucinations, unspecified: Principal | ICD-10-CM | POA: Insufficient documentation

## 2018-01-22 DIAGNOSIS — F319 Bipolar disorder, unspecified: Secondary | ICD-10-CM | POA: Diagnosis not present

## 2018-01-22 DIAGNOSIS — F13231 Sedative, hypnotic or anxiolytic dependence with withdrawal delirium: Secondary | ICD-10-CM

## 2018-01-22 DIAGNOSIS — G2579 Other drug induced movement disorders: Secondary | ICD-10-CM

## 2018-01-22 DIAGNOSIS — J449 Chronic obstructive pulmonary disease, unspecified: Secondary | ICD-10-CM | POA: Insufficient documentation

## 2018-01-22 DIAGNOSIS — F13939 Sedative, hypnotic or anxiolytic use, unspecified with withdrawal, unspecified: Secondary | ICD-10-CM | POA: Diagnosis present

## 2018-01-22 LAB — CBC WITH DIFFERENTIAL/PLATELET
BASOS PCT: 0 %
Basophils Absolute: 0 10*3/uL (ref 0.0–0.1)
EOS ABS: 0.2 10*3/uL (ref 0.0–0.7)
EOS PCT: 1 %
HCT: 38.7 % — ABNORMAL LOW (ref 39.0–52.0)
HEMOGLOBIN: 13 g/dL (ref 13.0–17.0)
Lymphocytes Relative: 9 %
Lymphs Abs: 1 10*3/uL (ref 0.7–4.0)
MCH: 31.3 pg (ref 26.0–34.0)
MCHC: 33.6 g/dL (ref 30.0–36.0)
MCV: 93 fL (ref 78.0–100.0)
MONOS PCT: 9 %
Monocytes Absolute: 0.9 10*3/uL (ref 0.1–1.0)
NEUTROS PCT: 81 %
Neutro Abs: 8.5 10*3/uL — ABNORMAL HIGH (ref 1.7–7.7)
PLATELETS: 158 10*3/uL (ref 150–400)
RBC: 4.16 MIL/uL — ABNORMAL LOW (ref 4.22–5.81)
RDW: 13.7 % (ref 11.5–15.5)
WBC: 10.5 10*3/uL (ref 4.0–10.5)

## 2018-01-22 LAB — URINALYSIS, ROUTINE W REFLEX MICROSCOPIC
BILIRUBIN URINE: NEGATIVE
GLUCOSE, UA: NEGATIVE mg/dL
Hgb urine dipstick: NEGATIVE
KETONES UR: 20 mg/dL — AB
Leukocytes, UA: NEGATIVE
NITRITE: NEGATIVE
PH: 5 (ref 5.0–8.0)
PROTEIN: NEGATIVE mg/dL
Specific Gravity, Urine: 1.027 (ref 1.005–1.030)

## 2018-01-22 LAB — RAPID URINE DRUG SCREEN, HOSP PERFORMED
Amphetamines: NOT DETECTED
Barbiturates: NOT DETECTED
Benzodiazepines: POSITIVE — AB
COCAINE: NOT DETECTED
OPIATES: NOT DETECTED
Tetrahydrocannabinol: NOT DETECTED

## 2018-01-22 LAB — ETHANOL: Alcohol, Ethyl (B): <10 mg/dL (ref <10)

## 2018-01-22 LAB — CK: Total CK: 3478 U/L — ABNORMAL HIGH (ref 49–397)

## 2018-01-22 MED ORDER — IPRATROPIUM-ALBUTEROL 0.5-2.5 (3) MG/3ML IN SOLN: 3.0000 mL | Freq: Four times a day (QID) | RESPIRATORY_TRACT | Status: DC | PRN

## 2018-01-22 MED ORDER — DULOXETINE HCL 30 MG PO CPEP: 60.0000 mg | ORAL_CAPSULE | Freq: Every day | ORAL | Status: DC

## 2018-01-22 MED ORDER — MORPHINE SULFATE 15 MG PO TABS: 15.0000 mg | ORAL_TABLET | Freq: Four times a day (QID) | ORAL | Status: DC | PRN

## 2018-01-22 MED ORDER — GABAPENTIN 300 MG PO CAPS
300.0000 mg | ORAL_CAPSULE | Freq: Three times a day (TID) | ORAL | Status: DC
  Administered 2018-01-22 – 2018-01-23 (×2): 300 mg via ORAL
  Filled 2018-01-22 (×3): qty 1

## 2018-01-22 MED ORDER — ACETAMINOPHEN 650 MG RE SUPP: 650.0000 mg | Freq: Four times a day (QID) | RECTAL | Status: DC | PRN

## 2018-01-22 MED ORDER — SODIUM CHLORIDE 0.9 % IV SOLN
INTRAVENOUS | Status: DC
  Administered 2018-01-22: 23:00:00 via INTRAVENOUS

## 2018-01-22 MED ORDER — HALOPERIDOL LACTATE 5 MG/ML IJ SOLN
2.0000 mg | Freq: Once | INTRAMUSCULAR | Status: AC
Start: 2018-01-22 — End: 2018-01-22
  Administered 2018-01-22: 2 mg via INTRAVENOUS
  Filled 2018-01-22: qty 1

## 2018-01-22 MED ORDER — CYPROHEPTADINE HCL 4 MG PO TABS
12.0000 mg | ORAL_TABLET | Freq: Once | ORAL | Status: DC
  Filled 2018-01-22: qty 3

## 2018-01-22 MED ORDER — CHLORPROMAZINE HCL 50 MG PO TABS
100.0000 mg | ORAL_TABLET | Freq: Every day | ORAL | Status: DC
  Administered 2018-01-22: 100 mg via ORAL
  Filled 2018-01-22: qty 2

## 2018-01-22 MED ORDER — GABAPENTIN 100 MG PO CAPS
100.0000 mg | ORAL_CAPSULE | Freq: Once | ORAL | Status: AC
  Administered 2018-01-22: 100 mg via ORAL
  Filled 2018-01-22: qty 1

## 2018-01-22 MED ORDER — ENOXAPARIN SODIUM 40 MG/0.4ML ~~LOC~~ SOLN: 40.0000 mg | SUBCUTANEOUS | Status: DC

## 2018-01-22 MED ORDER — TRAZODONE HCL 50 MG PO TABS
50.0000 mg | ORAL_TABLET | Freq: Once | ORAL | Status: AC
  Administered 2018-01-22: 50 mg via ORAL
  Filled 2018-01-22: qty 1

## 2018-01-22 MED ORDER — SODIUM CHLORIDE 0.9 % IV SOLN
Freq: Once | INTRAVENOUS | Status: AC
  Administered 2018-01-22: 16:00:00 via INTRAVENOUS

## 2018-01-22 MED ORDER — BENZTROPINE MESYLATE 1 MG PO TABS
1.0000 mg | ORAL_TABLET | Freq: Two times a day (BID) | ORAL | Status: DC
  Administered 2018-01-22 – 2018-01-23 (×2): 1 mg via ORAL
  Filled 2018-01-22 (×2): qty 1

## 2018-01-22 MED ORDER — ACETAMINOPHEN 325 MG PO TABS: 650.0000 mg | ORAL_TABLET | Freq: Four times a day (QID) | ORAL | Status: DC | PRN

## 2018-01-22 MED ORDER — MORPHINE SULFATE 15 MG PO TABS: 30.0000 mg | ORAL_TABLET | Freq: Three times a day (TID) | ORAL | Status: DC | PRN

## 2018-01-22 MED ORDER — SODIUM CHLORIDE 0.9 % IV BOLUS
500.0000 mL | Freq: Once | INTRAVENOUS | Status: AC
  Administered 2018-01-22: 500 mL via INTRAVENOUS

## 2018-01-22 NOTE — ED Triage Notes (Signed)
Patient's family reports that the patient is having reactions to his medications  That has been causing hallucinations, joint pain, and confusion. Patient denies any SI/HI, alcohol or drug use.

## 2018-01-22 NOTE — H&P (Signed)
History and Physical    [name] GQQ:[id number] DOB: April 15, 1956 DOA: 01/22/2018  PCP: Everardo Beals, NP Patient coming from: Home  Chief Complaint: Hallucinations  HPI: Dale [name] is a 62 y.o. male with medical history significant of chronic pain syndrome, COPD, chronic hepatitis C. Patient reports worsening hallucinations for the past 3 weeks. This coincides with the start of a benzodiazepine taper by his psychiatrist secondary to the patient's pain clinic requesting he come off of Xanax.  He reports having conversations with people and later on meeting with them and them denying previous interactions.  Per son, patient has complained about people breaking into his home when there was actually no evidence of break in. Patient is not a good historian and is unable to verify his home medications with me.  ED Course: Vitals: Afebrile, slightly tachycardic, normal respirations, slightly hypertensive Labs: Positive for benzodiazepines, CK of 3478 Imaging: None Medications/Course: gabapentin given  Review of Systems: Review of Systems  Constitutional: Negative for chills and fever.  Musculoskeletal: Positive for myalgias.  Neurological: Positive for tremors. Negative for sensory change, speech change, focal weakness, seizures, loss of consciousness and weakness.  Psychiatric/Behavioral: Positive for hallucinations. Negative for suicidal ideas. The patient is nervous/anxious.   All other systems reviewed and are negative.   Past Medical History:  Diagnosis Date  . Acute renal failure ([place]) 03/08/2013  . Back pain     Past Surgical History:  Procedure Laterality Date  . LEG SURGERY     left femur fx     reports that he has been smoking e-cigarettes and cigarettes.  He has a 50.00 pack-year smoking history. He has never used smokeless tobacco. He reports that he drinks alcohol. He reports that he has current or past drug history. Drugs: Cocaine and Marijuana.  Allergies    Allergen Reactions  . Penicillin G   . Penicillins Swelling    Has patient had a PCN reaction causing immediate rash, facial/tongue/throat swelling, SOB or lightheadedness with hypotension: No Has patient had a PCN reaction causing severe rash involving mucus membranes or skin necrosis: No Has patient had a PCN reaction that required hospitalization: No Has patient had a PCN reaction occurring within the last 10 years: No If all of the above answers are "NO", then may proceed with Cephalosporin use.    Family History  Problem Relation Age of Onset  . Hypertension Mother   . Heart failure Mother     Prior to Admission medications   Medication Sig Start Date End Date Taking? Authorizing Provider  ALPRAZolam Duanne Moron) 1 MG tablet Take 1 mg by mouth 4 (four) times daily as needed for anxiety.  03/06/16  Yes [provider]  benztropine (COGENTIN) 1 MG tablet Take 1 mg by mouth 2 (two) times daily. 01/06/18  Yes [provider]  chlorproMAZINE (THORAZINE) 100 MG tablet Take 100-300 mg by mouth See admin instructions. Take 1-3 tablets at night for sleep 01/08/18  Yes [provider]  cyclobenzaprine (FLEXERIL) 10 MG tablet Take 10 mg by mouth 3 (three) times daily as needed for muscle spasms.  11/19/17  Yes [provider]  DULoxetine (CYMBALTA) 60 MG capsule Take 60 mg by mouth daily. 07/13/14  Yes [provider]  Oxcarbazepine (TRILEPTAL) 300 MG tablet Take 300 mg by mouth 2 (two) times daily. 01/06/18  Yes [provider]  oxymorphone (OPANA) 10 MG tablet Take 10 mg by mouth every 8 (eight) hours. 01/15/18  Yes [provider]  albuterol (PROAIR HFA)  108 (90 Base) MCG/ACT inhaler 2 puffs every 4 hours as needed only  if your can't catch your breath Patient not taking: Reported on 01/22/2018 04/03/16   Tanda Rockers, MD    Physical Exam: Vitals:   01/22/18 1101 01/22/18 1110  BP: (!) 162/90   Pulse: (!) 114   Resp: 16   Temp: 98  F (36.7 C)   TempSrc: Oral   SpO2: 99%   Weight:  88.5 kg (195 lb)  Height:  6\' 3"  (1.905 m)     Constitutional: NAD, calm, comfortable Eyes: PERRL, lids and conjunctivae normal ENMT: Mucous membranes are moist. Posterior pharynx clear of any exudate or lesions.  Neck: normal, supple, no masses, no thyromegaly Respiratory: clear to auscultation bilaterally, no wheezing, no crackles. Normal respiratory effort. No accessory muscle use.  Cardiovascular: Regular rate and rhythm, no murmurs / rubs / gallops. No extremity edema. 2+ pedal pulses. Abdomen: no tenderness, no masses palpated. No hepatosplenomegaly. Bowel sounds positive.  Musculoskeletal: no clubbing / cyanosis. No joint deformity upper and lower extremities. Good ROM, no contractures. Normal muscle tone. Brace on left knee Skin: healing abrasion on right ankle Neurologic: CN 2-12 grossly intact. Sensation intact, DTR normal. Strength 5/5 in all 4. Did not elicit clonus on my exam. Alert and oriented x3 Psychiatric: Slightly odd affect. Hallucinations.   Labs on Admission: I have personally reviewed following labs and imaging studies  CBC: Recent Labs  Lab 01/22/18 1251  WBC 10.5  NEUTROABS 8.5*  HGB 13.0  HCT 38.7*  MCV 93.0  PLT 240   Basic Metabolic Panel: No results for input(s): NA, K, CL, CO2, GLUCOSE, BUN, CREATININE, CALCIUM, MG, PHOS in the last 168 hours. GFR: CrCl cannot be calculated (Patient's most recent lab result is older than the maximum 21 days allowed.). Liver Function Tests: No results for input(s): AST, ALT, ALKPHOS, BILITOT, PROT, ALBUMIN in the last 168 hours. No results for input(s): LIPASE, AMYLASE in the last 168 hours. No results for input(s): AMMONIA in the last 168 hours. Coagulation Profile: No results for input(s): INR, PROTIME in the last 168 hours. Cardiac Enzymes: Recent Labs  Lab 01/22/18 1251  CKTOTAL 3,478*   BNP (last 3 results) No results for input(s): PROBNP in the  last 8760 hours. HbA1C: No results for input(s): HGBA1C in the last 72 hours. CBG: No results for input(s): GLUCAP in the last 168 hours. Lipid Profile: No results for input(s): CHOL, HDL, LDLCALC, TRIG, CHOLHDL, LDLDIRECT in the last 72 hours. Thyroid Function Tests: No results for input(s): TSH, T4TOTAL, FREET4, T3FREE, THYROIDAB in the last 72 hours. Anemia Panel: No results for input(s): VITAMINB12, FOLATE, FERRITIN, TIBC, IRON, RETICCTPCT in the last 72 hours. Urine analysis:    Component Value Date/Time   COLORURINE YELLOW 01/22/2018 1330   APPEARANCEUR CLEAR 01/22/2018 1330   LABSPEC 1.027 01/22/2018 1330   PHURINE 5.0 01/22/2018 1330   GLUCOSEU NEGATIVE 01/22/2018 1330   HGBUR NEGATIVE 01/22/2018 1330   BILIRUBINUR NEGATIVE 01/22/2018 1330   KETONESUR 20 (A) 01/22/2018 1330   PROTEINUR NEGATIVE 01/22/2018 1330   UROBILINOGEN 1.0 07/22/2014 0753   NITRITE NEGATIVE 01/22/2018 1330   LEUKOCYTESUR NEGATIVE 01/22/2018 1330     Radiological Exams on Admission: No results found.   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 Continues to have hallucinations.  Low suspicion for serotonin syndrome. Patient had a similar episode 4 years ago secondary to medication withdrawal. -gabapentin 300 mg TID (discussed with psychiatry as an alternative for benzo withdrawal) -Psychiatry consult  COPD Stable. No wheezing -Duoneb prn  Chronic pain syndrome -Substitute Opana for morphine while inpatient.  Chronic hepatitis C S/p treatment. Last seen by infectious disease in march and infection has cleared.   DVT prophylaxis: Lovenox Code Status: DNR Family Communication: Son at bedside Disposition Plan: Anticipate discharge home tomorrow if symptoms improved. Consults  called: Psychiatry Admission status: Observation, telemetry   Cordelia Poche, MD [facility] Pager [phone]  If 7PM-7AM, please contact night-coverage [email or web address] Password [facility]  01/22/2018, 3:34 PM

## 2018-01-22 NOTE — ED Notes (Signed)
ED TO INPATIENT HANDOFF REPORT  Name/Age/Gender Dale [name] 62 y.o. male  Code Status Code Status History    Date Active Date Inactive Code Status Order ID Comments User Context   07/22/2014 0053 07/26/2014 2052 Full Code [id number]  [name], MD Inpatient   07/21/2014 1658 07/22/2014 0053 Full Code [id number]  [name] ED      Home/SNF/Other Home  Chief Complaint psych eval  Level of Care/Admitting Diagnosis ED Disposition    ED Disposition Condition [place] [facility] Area: [facility] [[id number]]  Level of Care: Telemetry [5]  Admit to tele based on following criteria: Other see comments  Comments: Possible benzodiazepine withdrawal  Diagnosis: Benzodiazepine withdrawal [facility]) [[id number]]  Admitting Physician: Mariel Aloe [phone]  Attending Physician: Mariel Aloe [phone]  PT Class (Do Not Modify): Observation [104]  PT Acc Code (Do Not Modify): Observation [10022]       Medical History Past Medical History:  Diagnosis Date  . Acute renal failure ([place]) 03/08/2013  . Back pain     Allergies Allergies  Allergen Reactions  . Penicillin G   . Penicillins Swelling    Has patient had a PCN reaction causing immediate rash, facial/tongue/throat swelling, SOB or lightheadedness with hypotension: No Has patient had a PCN reaction causing severe rash involving mucus membranes or skin necrosis: No Has patient had a PCN reaction that required hospitalization: No Has patient had a PCN reaction occurring within the last 10 years: No If all of the above answers are "NO", then may proceed with Cephalosporin use.    IV Location/Drains/Wounds Patient Lines/Drains/Airways Status   Active Line/Drains/Airways    Name:   Placement date:   Placement time:   Site:   Days:   External Urinary Catheter   07/22/14    0154    -   1280   Wound / Incision (Open or Dehisced)   -    -    -             Labs/Imaging Results for orders  placed or performed during the hospital encounter of 01/22/18 (from the past 48 hour(s))  CK     Status: Abnormal   Collection Time: 01/22/18 12:51 PM  Result Value Ref Range   Total CK 3,478 (H) 49 - 397 U/L    Comment: Performed at [facility], [place] [address], [place], [place] [address]  CBC with Differential/Platelet     Status: Abnormal   Collection Time: 01/22/18 12:51 PM  Result Value Ref Range   WBC 10.5 4.0 - 10.5 K/uL   RBC 4.16 (L) 4.22 - 5.81 MIL/uL   Hemoglobin 13.0 13.0 - 17.0 g/dL   HCT 38.7 (L) 39.0 - 52.0 %   MCV 93.0 78.0 - 100.0 fL   MCH 31.3 26.0 - 34.0 pg   MCHC 33.6 30.0 - 36.0 g/dL   RDW 13.7 11.5 - 15.5 %   Platelets 158 150 - 400 K/uL   Neutrophils Relative % 81 %   Neutro Abs 8.5 (H) 1.7 - 7.7 K/uL   Lymphocytes Relative 9 %   Lymphs Abs 1.0 0.7 - 4.0 K/uL   Monocytes Relative 9 %   Monocytes Absolute 0.9 0.1 - 1.0 K/uL   Eosinophils Relative 1 %   Eosinophils Absolute 0.2 0.0 - 0.7 K/uL   Basophils Relative 0 %   Basophils Absolute 0.0 0.0 - 0.1 K/uL    Comment: Performed at [facility], [place] [address], [place], [place] [address]  Ethanol     Status: None   Collection Time: 01/22/18 12:51 PM  Result Value Ref Range   Alcohol, Ethyl (B) <10 <10 mg/dL    Comment: (NOTE) Lowest detectable limit for serum alcohol is 10 mg/dL. For medical purposes only. Performed at [facility], [place] [address] [place], [place] [address]   Urinalysis, Routine w reflex microscopic     Status: Abnormal   Collection Time: 01/22/18  1:30 PM  Result Value Ref Range   Color, Urine YELLOW YELLOW   APPearance CLEAR CLEAR   Specific Gravity, Urine 1.027 1.005 - 1.030   pH 5.0 5.0 - 8.0   Glucose, UA NEGATIVE NEGATIVE mg/dL   Hgb urine dipstick NEGATIVE NEGATIVE   Bilirubin Urine NEGATIVE NEGATIVE   Ketones, ur 20 (A) NEGATIVE mg/dL   Protein, ur NEGATIVE NEGATIVE mg/dL   Nitrite NEGATIVE NEGATIVE   Leukocytes,  UA NEGATIVE NEGATIVE    Comment: Performed at [place] [address] [place], [place] [address]  Rapid urine drug screen (hospital performed)     Status: Abnormal   Collection Time: 01/22/18  1:30 PM  Result Value Ref Range   Opiates NONE DETECTED NONE DETECTED   Cocaine NONE DETECTED NONE DETECTED   Benzodiazepines POSITIVE (A) NONE DETECTED   Amphetamines NONE DETECTED NONE DETECTED   Tetrahydrocannabinol NONE DETECTED NONE DETECTED   Barbiturates NONE DETECTED NONE DETECTED    Comment: (NOTE) DRUG SCREEN FOR MEDICAL PURPOSES ONLY.  IF CONFIRMATION IS NEEDED FOR ANY PURPOSE, NOTIFY LAB WITHIN 5 DAYS. LOWEST DETECTABLE LIMITS FOR URINE DRUG SCREEN Drug Class                     Cutoff (ng/mL) Amphetamine and metabolites    1000 Barbiturate and metabolites    200 Benzodiazepine                 338 Tricyclics and metabolites     300 Opiates and metabolites        300 Cocaine and metabolites        300 THC                            50 Performed at [facility], [place] [address], [place], [place] [address]    No results found.  Pending Labs [employer] (From admission, onward)   Start     Ordered   Signed and Held  HIV antibody (Routine Testing)  Once,   R     Signed and Held   Signed and Held  Creatinine, serum  (enoxaparin (LOVENOX)    CrCl >/= 30 ml/min)  Weekly,   R    Comments:  while on enoxaparin therapy    Signed and Held   Signed and Held  Basic metabolic panel  Tomorrow morning,   R     Signed and Held   Signed and Held  CBC  Tomorrow morning,   R     Signed and Held      Vitals/Pain Today's Vitals   01/22/18 1101 01/22/18 1109 01/22/18 1110  BP: (!) 162/90    Pulse: (!) 114    Resp: 16    Temp: 98 F (36.7 C)    TempSrc: Oral    SpO2: 99%    Weight:   195 lb (88.5 kg)  Height:   6'  3" (1.905 m)  PainSc:  9      Isolation Precautions No active isolations  Medications Medications  cyproheptadine  (PERIACTIN) 4 MG tablet 12 mg (has no administration in time range)  gabapentin (NEURONTIN) capsule 100 mg (100 mg Oral Given 01/22/18 1358)    Mobility walks

## 2018-01-22 NOTE — ED Provider Notes (Signed)
[facility] DEPT Provider Note   CSN: [id number] Arrival date & time: 01/22/18  1050     History   Chief Complaint Chief Complaint  Patient presents with  . Psychiatric Evaluation    HPI Dale [name] is a 62 y.o. male.  Chief complaint is possible medication complication  HPI: 62 year old male.  History of chronic pain.  Followed at a pain management clinic Dr. Theodoro Grist.  Also has history of anxiety previously on Xanax from Dr. Reece Levy his psychiatrist.  Apparently he is also been on Cymbalta for greater than a year.  Family states that his pain management doctor became aware that his psychiatrist was prescribing Xanax and asked that he be taken off of this.  His psychiatrist gave a 2 to 3-week taper.  He was previously on a milligram 4 times per day.  He is now been off for about a week.  His taper was overlapped with oxcarbazepine/Trileptal which he started on 5/15.  He states that for the last several days he has been hallucinating and seeing small people inside the buildings and even some side his home.  He lives apparently in an apartment above or below where his family stays.  Is accompanied by his son, and brother here who expressed concern about it.  He has been somewhat confused over the last few days he is shaky and tremulous.  No auditory hallucinations.  Not suicidal or homicidal.  Eating and drinking without difficulty  Past Medical History:  Diagnosis Date  . Acute renal failure ([place]) 03/08/2013  . Back pain     Patient Active Problem List   Diagnosis Date Noted  . Chronic hepatitis C without hepatic coma ([place]) 05/12/2017  . Dyspnea 05/13/2016  . 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 Palliative care encounter   . Chronic pain syndrome   . Acute encephalopathy 07/22/2014  . Benzodiazepine withdrawal ([place]) 07/21/2014  . Delirium tremens ([place]) 07/21/2014  . Malnutrition of moderate degree ([place]) 03/10/2013    Past Surgical  History:  Procedure Laterality Date  . LEG SURGERY     left femur fx        Home Medications    Prior to Admission medications   Medication Sig Start Date End Date Taking? Authorizing Provider  ALPRAZolam Duanne Moron) 1 MG tablet Take 1 mg by mouth 4 (four) times daily as needed for anxiety.  03/06/16  Yes [provider]  benztropine (COGENTIN) 1 MG tablet Take 1 mg by mouth 2 (two) times daily. 01/06/18  Yes [provider]  chlorproMAZINE (THORAZINE) 100 MG tablet Take 100-300 mg by mouth See admin instructions. Take 1-3 tablets at night for sleep 01/08/18  Yes [provider]  cyclobenzaprine (FLEXERIL) 10 MG tablet Take 10 mg by mouth 3 (three) times daily as needed for muscle spasms.  11/19/17  Yes [provider]  DULoxetine (CYMBALTA) 60 MG capsule Take 60 mg by mouth daily. 07/13/14  Yes [provider]  Oxcarbazepine (TRILEPTAL) 300 MG tablet Take 300 mg by mouth 2 (two) times daily. 01/06/18  Yes [provider]  oxymorphone (OPANA) 10 MG tablet Take 10 mg by mouth every 8 (eight) hours. 01/15/18  Yes [provider]  albuterol (PROAIR HFA) 108 (90 Base) MCG/ACT inhaler 2 puffs every 4 hours as needed only  if your can't catch your breath Patient not taking: Reported on 01/22/2018 04/03/16   Tanda Rockers, MD    Family History Family History  Problem Relation  Age of Onset  . Hypertension Mother   . Heart failure Mother     Social History Social History   Tobacco Use  . Smoking status: Current Every Day Smoker    Packs/day: 1.00    Years: 50.00    Pack years: 50.00    Types: E-cigarettes, Cigarettes    Last attempt to quit: 08/25/2014    Years since quitting: 3.4  . Smokeless tobacco: Never Used  Substance Use Topics  . Alcohol use: Yes    Comment: occasionally  . Drug use: Not Currently    Types: Cocaine, Marijuana    Comment: hx cocaine years ago     Allergies   Penicillin g and Penicillins   Review  of Systems Review of Systems  Constitutional: Positive for diaphoresis. Negative for appetite change, chills, fatigue and fever.  HENT: Negative for mouth sores, sore throat and trouble swallowing.   Eyes: Negative for visual disturbance.  Respiratory: Negative for cough, chest tightness, shortness of breath and wheezing.   Cardiovascular: Negative for chest pain.  Gastrointestinal: Negative for abdominal distention, abdominal pain, diarrhea, nausea and vomiting.  Endocrine: Negative for polydipsia, polyphagia and polyuria.  Genitourinary: Negative for dysuria, frequency and hematuria.  Musculoskeletal: Positive for gait problem.  Skin: Negative for color change, pallor and rash.  Neurological: Positive for tremors and weakness. Negative for dizziness, syncope, light-headedness and headaches.  Hematological: Does not bruise/bleed easily.  Psychiatric/Behavioral: Positive for hallucinations. Negative for behavioral problems and confusion.     Physical Exam Updated Vital Signs BP (!) 162/90 (BP Location: Left Arm)   Pulse (!) 114   Temp 98 F (36.7 C) (Oral)   Resp 16   Ht 6\' 3"  (1.905 m)   Wt 88.5 kg (195 lb)   SpO2 99%   BMI 24.37 kg/m   Physical Exam  Constitutional: He is oriented to person, place, and time. He appears well-developed and well-nourished. No distress.  Awake and alert.  No distress.  At times has difficulty with concentration or following conversation.  However oriented and lucid.  Check oral temp 100.2 by myself  HENT:  Head: Normocephalic.  No abnormalities of extraocular movements or ocular nystagmus  Eyes: Pupils are equal, round, and reactive to light. Conjunctivae are normal. No scleral icterus.  Neck: Normal range of motion. Neck supple. No thyromegaly present.  Cardiovascular: Normal rate and regular rhythm. Exam reveals no gallop and no friction rub.  No murmur heard. Pulmonary/Chest: Effort normal and breath sounds normal. No respiratory distress.  He has no wheezes. He has no rales.  Abdominal: Soft. Bowel sounds are normal. He exhibits no distension. There is no tenderness. There is no rebound.  Musculoskeletal: Normal range of motion.  No rigidity.  He does have resting tremor  Neurological: He is alert and oriented to person, place, and time.  Resting tremor.  His upper extremity reflexes are normal.  His Achilles and knee jerk are normal.  He has 4 beat clonus.  Ambulated him.  He has a slightly unsteady gait  Skin: Skin is warm and dry. No rash noted.  Psychiatric: He has a normal mood and affect. His behavior is normal.     ED Treatments / Results  Labs (all labs ordered are listed, but only abnormal results are displayed) Labs Reviewed  URINALYSIS, ROUTINE W REFLEX MICROSCOPIC - Abnormal; Notable for the following components:      Result Value   Ketones, ur 20 (*)    All other components within normal limits  CK - Abnormal; Notable for the following components:   Total CK 3,478 (*)    All other components within normal limits  CBC WITH DIFFERENTIAL/PLATELET - Abnormal; Notable for the following components:   RBC 4.16 (*)    HCT 38.7 (*)    Neutro Abs 8.5 (*)    All other components within normal limits  RAPID URINE DRUG SCREEN, HOSP PERFORMED - Abnormal; Notable for the following components:   Benzodiazepines POSITIVE (*)    All other components within normal limits  ETHANOL    EKG None  Radiology No results found.  Procedures Procedures (including critical care time)  Medications Ordered in ED Medications  cyproheptadine (PERIACTIN) 4 MG tablet 12 mg (has no administration in time range)  gabapentin (NEURONTIN) capsule 100 mg (100 mg Oral Given 01/22/18 1358)     Initial Impression / Assessment and Plan / ED Course  I have reviewed the triage vital signs and the nursing notes.  Pertinent labs & imaging results that were available during my care of the patient were reviewed by me and considered in my  medical decision making (see chart for details).    Possibilities would include benzodiazepine withdrawal with hallucinations.  Visual hallucinations would be organic I do not feel this is consistent with an acute psychiatric illness or break.  Concern also for possible serotonin syndrome as his CPK is elevated.  He has low-grade fever.  He has some muscle stiffness although is not rigid and he has clonus.  Was given cyproheptadine here.  Will place IV for hydration.  I discussed the case with psychiatry initially and I have called them again to ask for formal consultation regarding med management.  Also discussed with Dr. Lonny Prude of internal medicine.  Plan will be cyproheptadine, fluids, hold Cymbalta.  Final Clinical Impressions(s) / ED Diagnoses   Final diagnoses:  None    ED Discharge Orders    None       Tanna Furry, MD 01/22/18 1547

## 2018-01-22 NOTE — ED Notes (Signed)
Bed: [facility]4 Expected date:  Expected time:  Means of arrival:  Comments: 

## 2018-01-22 NOTE — Progress Notes (Signed)
Patient arrived on unit from ED.  Son at bedside. Telemetry placed per MD order and CMT notified.

## 2018-01-22 NOTE — ED Notes (Signed)
Pt has urinal and aware of need for urine specimen

## 2018-01-23 DIAGNOSIS — T796XXA Traumatic ischemia of muscle, initial encounter: Secondary | ICD-10-CM

## 2018-01-23 DIAGNOSIS — M2550 Pain in unspecified joint: Secondary | ICD-10-CM | POA: Diagnosis not present

## 2018-01-23 DIAGNOSIS — R443 Hallucinations, unspecified: Secondary | ICD-10-CM

## 2018-01-23 DIAGNOSIS — T796XXD Traumatic ischemia of muscle, subsequent encounter: Secondary | ICD-10-CM | POA: Diagnosis not present

## 2018-01-23 DIAGNOSIS — G894 Chronic pain syndrome: Secondary | ICD-10-CM | POA: Diagnosis not present

## 2018-01-23 DIAGNOSIS — F13232 Sedative, hypnotic or anxiolytic dependence with withdrawal with perceptual disturbance: Secondary | ICD-10-CM | POA: Diagnosis not present

## 2018-01-23 DIAGNOSIS — F17290 Nicotine dependence, other tobacco product, uncomplicated: Secondary | ICD-10-CM | POA: Diagnosis not present

## 2018-01-23 LAB — CBC
HEMATOCRIT: 35.3 % — AB (ref 39.0–52.0)
HEMOGLOBIN: 11.5 g/dL — AB (ref 13.0–17.0)
MCH: 30.3 pg (ref 26.0–34.0)
MCHC: 32.6 g/dL (ref 30.0–36.0)
MCV: 93.1 fL (ref 78.0–100.0)
Platelets: 153 10*3/uL (ref 150–400)
RBC: 3.79 MIL/uL — ABNORMAL LOW (ref 4.22–5.81)
RDW: 13.7 % (ref 11.5–15.5)
WBC: 6.1 10*3/uL (ref 4.0–10.5)

## 2018-01-23 LAB — BASIC METABOLIC PANEL
ANION GAP: 11 (ref 5–15)
BUN: 13 mg/dL (ref 6–20)
CALCIUM: 8.5 mg/dL — AB (ref 8.9–10.3)
CO2: 25 mmol/L (ref 22–32)
Chloride: 106 mmol/L (ref 101–111)
Creatinine, Ser: 0.65 mg/dL (ref 0.61–1.24)
GFR calc non Af Amer: >60 mL/min (ref >60)
Glucose, Bld: 103 mg/dL — ABNORMAL HIGH (ref 65–99)
POTASSIUM: 3.4 mmol/L — AB (ref 3.5–5.1)
SODIUM: 142 mmol/L (ref 135–145)

## 2018-01-23 LAB — CK: CK TOTAL: 1604 U/L — AB (ref 49–397)

## 2018-01-23 MED ORDER — POTASSIUM CHLORIDE CRYS ER 10 MEQ PO TBCR
40.0000 meq | EXTENDED_RELEASE_TABLET | Freq: Once | ORAL | Status: AC
  Administered 2018-01-23: 40 meq via ORAL
  Filled 2018-01-23: qty 4

## 2018-01-23 NOTE — Progress Notes (Signed)
Telemetry replaced on patient and CMT notified.

## 2018-01-23 NOTE — Progress Notes (Signed)
Discharge instructions and medications discussed with patient.  AVS given to patient.  All questions answered.  

## 2018-01-23 NOTE — Progress Notes (Signed)
Restless,unsteady gait, mild confusion at 2000(pulled out IV/refused cardiac monitor). Pm meds given at 2030 with trazodone 50mg .  Moderate relief noted with evening medications. Intermittent  Impulsivity/restlessness posed safety risk. Haldol 2mg  adm at 2245 with excellent relief.  Slept for 5 hours.  Encouraged patient to continue resting with good results.Continue to montior
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
## 2018-01-23 NOTE — Consult Note (Signed)
[place] Psychiatry Consult   Reason for Consult:  ''hallucinations, Benzodiazepine withdrawal.'' Referring Physician:  Dr. Elon Jester Patient Identification: Dale [name] MRN:  [id number] Principal Diagnosis: Benzodiazepine withdrawal [facility]) Diagnosis:   Patient Active Problem List   Diagnosis Date Noted  . Chronic hepatitis C without hepatic coma ([place]) [B18.2] 05/12/2017  . Dyspnea [R06.00] 05/13/2016  . 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 Palliative care encounter [Z51.5]   . Chronic pain syndrome [G89.4]   . Acute encephalopathy [G93.40] 07/22/2014  . Benzodiazepine withdrawal ([place]) [F13.239] 07/21/2014  . Delirium tremens ([place]) [F10.231] 07/21/2014  . Malnutrition of moderate degree ([place]) [E44.0] 03/10/2013    Total Time spent with patient: 45 minutes  Subjective:   Dale [name] is a 62 y.o. male patient admitted with hallucinations.  HPI:  Patient with history of Chronic pain panic disorder. He is followed at a pain management clinic by Dr. Theodoro Grist and his psychiatrist is Dr. Reece Levy. Patient reports that he started hallucinating few days ago when his psychiatrist decided to wean him off Alprazolam so that he can receives opiates for his chronic pain. Today, patient denies depression, anxiety, psychosis of delusional thinking. He denies drugs and alcohol abuse. Past Psychiatric History: as above  Risk to Self:   Risk to Others:   Prior Inpatient Therapy:   Prior Outpatient Therapy:  Dr. [facility]   Past Medical History:  Past Medical History:  Diagnosis Date  . Acute renal failure ([place]) 03/08/2013  . Back pain     Past Surgical History:  Procedure Laterality Date  . LEG SURGERY     left femur fx   Family History:  Family History  Problem Relation Age of Onset  . Hypertension Mother   . Heart failure Mother    Family Psychiatric  History:  Social History:  Social History   Substance and Sexual Activity  Alcohol Use Yes    Comment: occasionally     Social History   Substance and Sexual Activity  Drug Use Not Currently  . Types: Cocaine, Marijuana   Comment: hx cocaine years ago    Social History   Socioeconomic History  . Marital status: Single    Spouse name: Not on file  . Number of children: Not on file  . Years of education: Not on file  . Highest education level: Not on file  Occupational History  . Not on file  Social Needs  . Financial resource strain: Not on file  . Food insecurity:    Worry: Not on file    Inability: Not on file  . Transportation needs:    Medical: Not on file    Non-medical: Not on file  Tobacco Use  . Smoking status: Current Every Day Smoker    Packs/day: 1.00    Years: 50.00    Pack years: 50.00    Types: E-cigarettes, Cigarettes    Last attempt to quit: 08/25/2014    Years since quitting: 3.4  . Smokeless tobacco: Never Used  Substance and Sexual Activity  . Alcohol use: Yes    Comment: occasionally  . Drug use: Not Currently    Types: Cocaine, Marijuana    Comment: hx cocaine years ago  . Sexual activity: Yes    Partners: Female  Lifestyle  . Physical activity:    Days per week: Not on file    Minutes per session: Not on file  . Stress: Not on file  Relationships  . Social connections:  Talks on phone: Not on file    Gets together: Not on file    Attends religious service: Not on file    Active member of club or organization: Not on file    Attends meetings of clubs or organizations: Not on file    Relationship status: Not on file  Other Topics Concern  . Not on file  Social History Narrative  . Not on file   Additional Social History:    Allergies:   Allergies  Allergen Reactions  . Penicillin G   . Penicillins Swelling    Has patient had a PCN reaction causing immediate rash, facial/tongue/throat swelling, SOB or lightheadedness with hypotension: No Has patient had a PCN reaction causing severe rash involving mucus membranes or skin  necrosis: No Has patient had a PCN reaction that required hospitalization: No Has patient had a PCN reaction occurring within the last 10 years: No If all of the above answers are "NO", then may proceed with Cephalosporin use.    Labs:  Results for orders placed or performed during the hospital encounter of 01/22/18 (from the past 48 hour(s))  CK     Status: Abnormal   Collection Time: 01/22/18 12:51 PM  Result Value Ref Range   Total CK 3,478 (H) 49 - 397 U/L    Comment: Performed at [facility], [place] [address] [place], [place] [address]  CBC with Differential/Platelet     Status: Abnormal   Collection Time: 01/22/18 12:51 PM  Result Value Ref Range   WBC 10.5 4.0 - 10.5 K/uL   RBC 4.16 (L) 4.22 - 5.81 MIL/uL   Hemoglobin 13.0 13.0 - 17.0 g/dL   HCT 38.7 (L) 39.0 - 52.0 %   MCV 93.0 78.0 - 100.0 fL   MCH 31.3 26.0 - 34.0 pg   MCHC 33.6 30.0 - 36.0 g/dL   RDW 13.7 11.5 - 15.5 %   Platelets 158 150 - 400 K/uL   Neutrophils Relative % 81 %   Neutro Abs 8.5 (H) 1.7 - 7.7 K/uL   Lymphocytes Relative 9 %   Lymphs Abs 1.0 0.7 - 4.0 K/uL   Monocytes Relative 9 %   Monocytes Absolute 0.9 0.1 - 1.0 K/uL   Eosinophils Relative 1 %   Eosinophils Absolute 0.2 0.0 - 0.7 K/uL   Basophils Relative 0 %   Basophils Absolute 0.0 0.0 - 0.1 K/uL    Comment: Performed at [facility], [place] [address] [place], [place] [address]  Ethanol     Status: None   Collection Time: 01/22/18 12:51 PM  Result Value Ref Range   Alcohol, Ethyl (B) <10 <10 mg/dL    Comment: (NOTE) Lowest detectable limit for serum alcohol is 10 mg/dL. For medical purposes only. Performed at [facility], [place] [address], [place], [place] [address]   Urinalysis, Routine w reflex microscopic     Status: Abnormal   Collection Time: 01/22/18  1:30 PM  Result Value Ref Range   Color, Urine YELLOW YELLOW   APPearance CLEAR CLEAR   Specific Gravity, Urine 1.027 1.005 -  1.030   pH 5.0 5.0 - 8.0   Glucose, UA NEGATIVE NEGATIVE mg/dL   Hgb urine dipstick NEGATIVE NEGATIVE   Bilirubin Urine NEGATIVE NEGATIVE   Ketones, ur 20 (A) NEGATIVE mg/dL   Protein, ur NEGATIVE NEGATIVE mg/dL   Nitrite NEGATIVE NEGATIVE   Leukocytes, UA NEGATIVE NEGATIVE    Comment: Performed at [facility], [address] [place], [place] [address]  Rapid urine drug screen (hospital performed)     Status: Abnormal   Collection Time: 01/22/18  1:30 PM  Result Value Ref Range   Opiates NONE DETECTED NONE DETECTED   Cocaine NONE DETECTED NONE DETECTED   Benzodiazepines POSITIVE (A) NONE DETECTED   Amphetamines NONE DETECTED NONE DETECTED   Tetrahydrocannabinol NONE DETECTED NONE DETECTED   Barbiturates NONE DETECTED NONE DETECTED    Comment: (NOTE) DRUG SCREEN FOR MEDICAL PURPOSES ONLY.  IF CONFIRMATION IS NEEDED FOR ANY PURPOSE, NOTIFY LAB WITHIN 5 DAYS. LOWEST DETECTABLE LIMITS FOR URINE DRUG SCREEN Drug Class                     Cutoff (ng/mL) Amphetamine and metabolites    1000 Barbiturate and metabolites    200 Benzodiazepine                 132 Tricyclics and metabolites     300 Opiates and metabolites        300 Cocaine and metabolites        300 THC                            50 Performed at [facility], [place] [address], [place], [place] [address]   Basic metabolic panel     Status: Abnormal   Collection Time: 01/23/18  6:03 AM  Result Value Ref Range   Sodium 142 135 - 145 mmol/L   Potassium 3.4 (L) 3.5 - 5.1 mmol/L   Chloride 106 101 - 111 mmol/L   CO2 25 22 - 32 mmol/L   Glucose, Bld 103 (H) 65 - 99 mg/dL   BUN 13 6 - 20 mg/dL   Creatinine, Ser 0.65 0.61 - 1.24 mg/dL   Calcium 8.5 (L) 8.9 - 10.3 mg/dL   GFR calc non Af Amer >60 >60 mL/min   GFR calc Af Amer >60 >60 mL/min    Comment: (NOTE) The eGFR has been calculated using the CKD EPI equation. This calculation has not been validated in all clinical  situations. eGFR's persistently <60 mL/min signify possible Chronic Kidney Disease.    Anion gap 11 5 - 15    Comment: Performed at [facility], [place] [address], [place], [place] [address]  CBC     Status: Abnormal   Collection Time: 01/23/18  6:03 AM  Result Value Ref Range   WBC 6.1 4.0 - 10.5 K/uL   RBC 3.79 (L) 4.22 - 5.81 MIL/uL   Hemoglobin 11.5 (L) 13.0 - 17.0 g/dL   HCT 35.3 (L) 39.0 - 52.0 %   MCV 93.1 78.0 - 100.0 fL   MCH 30.3 26.0 - 34.0 pg   MCHC 32.6 30.0 - 36.0 g/dL   RDW 13.7 11.5 - 15.5 %   Platelets 153 150 - 400 K/uL    Comment: Performed at [facility], [place] [address], [place], [place] [address]  CK     Status: Abnormal   Collection Time: 01/23/18  6:03 AM  Result Value Ref Range   Total CK 1,604 (H) 49 - 397 U/L    Comment: Performed at [facility], [place] [address] [place], [place] [address]    Current Facility-Administered Medications  Medication Dose Route Frequency Provider Last Rate Last Dose  . 0.9 %  sodium chloride infusion   Intravenous Continuous Desiree Hane, MD 100  mL/hr at 01/22/18 2247    . acetaminophen (TYLENOL) tablet 650 mg  650 mg Oral Q6H PRN Mariel Aloe, MD       Or  . acetaminophen (TYLENOL) suppository 650 mg  650 mg Rectal Q6H PRN Mariel Aloe, MD      . benztropine (COGENTIN) tablet 1 mg  1 mg Oral BID Mariel Aloe, MD   1 mg at 01/23/18 0919  . chlorproMAZINE (THORAZINE) tablet 100 mg  100 mg Oral QHS Mariel Aloe, MD   100 mg at 01/22/18 2025  . cyproheptadine (PERIACTIN) 4 MG tablet 12 mg  12 mg Oral Once Tanna Furry, MD      . enoxaparin (LOVENOX) injection 40 mg  40 mg Subcutaneous Q24H Mariel Aloe, MD      . gabapentin (NEURONTIN) capsule 300 mg  300 mg Oral TID Mariel Aloe, MD   300 mg at 01/23/18 0919  . ipratropium-albuterol (DUONEB) 0.5-2.5 (3) MG/3ML nebulizer solution 3 mL  3 mL Nebulization Q6H PRN Mariel Aloe, MD      . morphine  (MSIR) tablet 15 mg  15 mg Oral Q6H PRN Mariel Aloe, MD        Musculoskeletal: Strength & Muscle Tone: within normal limits Gait & Station: not tested Patient leans: N/A  Psychiatric Specialty Exam: Physical Exam  Psychiatric: He has a normal mood and affect. His speech is normal and behavior is normal. Judgment and thought content normal. Cognition and memory are normal.    Review of Systems  Constitutional: Negative.   HENT: Negative.   Respiratory: Negative.   Cardiovascular: Negative.   Musculoskeletal: Positive for joint pain.  Skin: Negative.   Neurological: Negative.   Endo/Heme/Allergies: Negative.   Psychiatric/Behavioral: Negative.     Blood pressure 132/82, pulse 90, temperature 98.2 F (36.8 C), temperature source Oral, resp. rate 15, height '6\' 3"'$  (1.905 m), weight 88.5 kg (195 lb), SpO2 99 %.Body mass index is 24.37 kg/m.  General Appearance: Casual  Eye Contact:  Good  Speech:  Clear and Coherent  Volume:  Normal  Mood:  Euthymic  Affect:  Appropriate  Thought Process:  Coherent  Orientation:  Full (Time, Place, and Person)  Thought Content:  Logical  Suicidal Thoughts:  No  Homicidal Thoughts:  No  Memory:  Immediate;   Fair Recent;   Fair Remote;   Fair  Judgement:  Intact  Insight:  Fair  Psychomotor Activity:  Normal  Concentration:  Concentration: Fair and Attention Span: Fair  Recall:  Good  Fund of Knowledge:  Good  Language:  Good  Akathisia:  No  Handed:  Right  AIMS (if indicated):     Assets:  Communication Skills Desire for Improvement  ADL's:  Intact  Cognition:  WNL  Sleep:   fair     Treatment Plan Summary: 62 year old man with history of mental illness and Chronic pain who was admitted due to hallucinations in the context of benzodiazepine withdrawal. Today, patient is calm, cooperative and denies psychosis or delusions.  Plan /Recommendations: -No acute psychiatric intervention necessary at this time -Continue  Thorazine and Gabapentin as prescribed. -Patient is cleared by psychiatric service.   Disposition: No evidence of imminent risk to self or others at present.   Patient does not meet criteria for psychiatric inpatient admission. Supportive therapy provided about ongoing stressors. Unit social worker to assist with out patient referral to Dr. Reece Levy for medication management  Corena Pilgrim, MD 01/23/2018 11:58  AM

## 2018-01-24 LAB — HIV ANTIBODY (ROUTINE TESTING W REFLEX): HIV Screen 4th Generation wRfx: NONREACTIVE

## 2018-05-19 ENCOUNTER — Encounter (HOSPITAL_COMMUNITY): Payer: Self-pay | Admitting: *Deleted

## 2018-05-19 ENCOUNTER — Emergency Department (HOSPITAL_COMMUNITY): Payer: Medicare Other

## 2018-05-19 ENCOUNTER — Other Ambulatory Visit: Payer: Self-pay

## 2018-05-19 ENCOUNTER — Inpatient Hospital Stay (HOSPITAL_COMMUNITY)
Admission: EM | Admit: 2018-05-19 | Discharge: 2018-05-27 | DRG: 177 | Disposition: A | Discharge status: Home / Self Care | Payer: Medicare Other | Attending: Internal Medicine | Admitting: Internal Medicine

## 2018-05-19 DIAGNOSIS — J189 Pneumonia, unspecified organism: Secondary | ICD-10-CM | POA: Diagnosis present

## 2018-05-19 DIAGNOSIS — J851 Abscess of lung with pneumonia: Principal | ICD-10-CM | POA: Diagnosis present

## 2018-05-19 DIAGNOSIS — E876 Hypokalemia: Secondary | ICD-10-CM | POA: Diagnosis present

## 2018-05-19 DIAGNOSIS — F17210 Nicotine dependence, cigarettes, uncomplicated: Secondary | ICD-10-CM | POA: Diagnosis present

## 2018-05-19 DIAGNOSIS — J984 Other disorders of lung: Secondary | ICD-10-CM

## 2018-05-19 DIAGNOSIS — R9431 Abnormal electrocardiogram [ECG] [EKG]: Secondary | ICD-10-CM | POA: Diagnosis present

## 2018-05-19 DIAGNOSIS — J90 Pleural effusion, not elsewhere classified: Secondary | ICD-10-CM | POA: Diagnosis present

## 2018-05-19 DIAGNOSIS — D630 Anemia in neoplastic disease: Secondary | ICD-10-CM | POA: Diagnosis present

## 2018-05-19 DIAGNOSIS — R0902 Hypoxemia: Secondary | ICD-10-CM

## 2018-05-19 DIAGNOSIS — Z79899 Other long term (current) drug therapy: Secondary | ICD-10-CM

## 2018-05-19 DIAGNOSIS — F418 Other specified anxiety disorders: Secondary | ICD-10-CM | POA: Diagnosis present

## 2018-05-19 DIAGNOSIS — G894 Chronic pain syndrome: Secondary | ICD-10-CM | POA: Diagnosis present

## 2018-05-19 DIAGNOSIS — J449 Chronic obstructive pulmonary disease, unspecified: Secondary | ICD-10-CM | POA: Diagnosis present

## 2018-05-19 DIAGNOSIS — R918 Other nonspecific abnormal finding of lung field: Secondary | ICD-10-CM

## 2018-05-19 DIAGNOSIS — R590 Localized enlarged lymph nodes: Secondary | ICD-10-CM | POA: Diagnosis present

## 2018-05-19 DIAGNOSIS — J440 Chronic obstructive pulmonary disease with acute lower respiratory infection: Secondary | ICD-10-CM | POA: Diagnosis present

## 2018-05-19 DIAGNOSIS — I5032 Chronic diastolic (congestive) heart failure: Secondary | ICD-10-CM | POA: Diagnosis present

## 2018-05-19 DIAGNOSIS — B182 Chronic viral hepatitis C: Secondary | ICD-10-CM | POA: Diagnosis present

## 2018-05-19 DIAGNOSIS — F319 Bipolar disorder, unspecified: Secondary | ICD-10-CM | POA: Diagnosis present

## 2018-05-19 DIAGNOSIS — C3490 Malignant neoplasm of unspecified part of unspecified bronchus or lung: Secondary | ICD-10-CM | POA: Diagnosis present

## 2018-05-19 DIAGNOSIS — J9621 Acute and chronic respiratory failure with hypoxia: Secondary | ICD-10-CM | POA: Diagnosis present

## 2018-05-19 DIAGNOSIS — Z9889 Other specified postprocedural states: Secondary | ICD-10-CM

## 2018-05-19 LAB — BASIC METABOLIC PANEL
ANION GAP: 12 (ref 5–15)
BUN: 11 mg/dL (ref 8–23)
CO2: 24 mmol/L (ref 22–32)
CREATININE: 0.8 mg/dL (ref 0.61–1.24)
Calcium: 8.4 mg/dL — ABNORMAL LOW (ref 8.9–10.3)
Chloride: 101 mmol/L (ref 98–111)
Glucose, Bld: 119 mg/dL — ABNORMAL HIGH (ref 70–99)
Potassium: 3.3 mmol/L — ABNORMAL LOW (ref 3.5–5.1)
Sodium: 137 mmol/L (ref 135–145)

## 2018-05-19 LAB — CBC
HCT: 37.4 % — ABNORMAL LOW (ref 39.0–52.0)
HEMOGLOBIN: 12.1 g/dL — AB (ref 13.0–17.0)
MCH: 29.1 pg (ref 26.0–34.0)
MCHC: 32.4 g/dL (ref 30.0–36.0)
MCV: 89.9 fL (ref 78.0–100.0)
PLATELETS: 368 10*3/uL (ref 150–400)
RBC: 4.16 MIL/uL — AB (ref 4.22–5.81)
RDW: 12.4 % (ref 11.5–15.5)
WBC: 14.1 10*3/uL — ABNORMAL HIGH (ref 4.0–10.5)

## 2018-05-19 LAB — I-STAT TROPONIN, ED: TROPONIN I, POC: 0.01 ng/mL (ref 0.00–0.08)

## 2018-05-19 LAB — I-STAT CG4 LACTIC ACID, ED: Lactic Acid, Venous: 0.86 mmol/L (ref 0.5–1.9)

## 2018-05-19 LAB — ETHANOL: Alcohol, Ethyl (B): <10 mg/dL (ref <10)

## 2018-05-19 LAB — AMMONIA: Ammonia: 27 umol/L (ref 9–35)

## 2018-05-19 MED ORDER — SODIUM CHLORIDE 0.9 % IV SOLN
2.0000 g | Freq: Three times a day (TID) | INTRAVENOUS | Status: DC
  Administered 2018-05-20 – 2018-05-21 (×4): 2 g via INTRAVENOUS
  Filled 2018-05-19 (×6): qty 2

## 2018-05-19 MED ORDER — VANCOMYCIN HCL IN DEXTROSE 1-5 GM/200ML-% IV SOLN
1000.0000 mg | Freq: Three times a day (TID) | INTRAVENOUS | Status: DC
  Administered 2018-05-20 – 2018-05-21 (×4): 1000 mg via INTRAVENOUS
  Filled 2018-05-19 (×5): qty 200

## 2018-05-19 MED ORDER — VANCOMYCIN HCL IN DEXTROSE 1-5 GM/200ML-% IV SOLN: 1000.0000 mg | Freq: Once | INTRAVENOUS | Status: DC

## 2018-05-19 MED ORDER — VANCOMYCIN HCL 10 G IV SOLR
2000.0000 mg | Freq: Once | INTRAVENOUS | Status: AC
  Administered 2018-05-19: 2000 mg via INTRAVENOUS
  Filled 2018-05-19: qty 2000

## 2018-05-19 MED ORDER — SODIUM CHLORIDE 0.9 % IV SOLN
2.0000 g | Freq: Once | INTRAVENOUS | Status: AC
  Administered 2018-05-19: 2 g via INTRAVENOUS
  Filled 2018-05-19: qty 2

## 2018-05-19 MED ORDER — METRONIDAZOLE IN NACL 5-0.79 MG/ML-% IV SOLN
500.0000 mg | Freq: Three times a day (TID) | INTRAVENOUS | Status: DC
  Administered 2018-05-19 – 2018-05-21 (×5): 500 mg via INTRAVENOUS
  Filled 2018-05-19 (×5): qty 100

## 2018-05-19 NOTE — ED Triage Notes (Signed)
Pt reports that he has been feeling short of breath for about 1 week, pain in his chest with breathing. +cough. Unsure of fevers. He did take oxymorphone for pain this morning. Saturations 90-92% in triage.

## 2018-05-19 NOTE — ED Provider Notes (Signed)
[facility] EMERGENCY DEPARTMENT Provider Note   CSN: [id number] Arrival date & time: 05/19/18  2042     History   Chief Complaint Chief Complaint  Patient presents with  . Shortness of Breath    HPI Dale [name] is a 62 y.o. male with a past medical history of chronic hepatitis C, chronic pain, malnutrition, who presents today for evaluation of shortness of breath.  He reports that for the past week he has been having worsening shortness of breath with chest pain when breathing.  He reports a cough, he is unsure if he has had fevers.  He has had swelling to his bilateral lower legs for the past few weeks.  Upon arrival he was found to be at 88% on room air, was placed on 2 L and is now at 96%.  HPI  Past Medical History:  Diagnosis Date  . Acute renal failure ([place]) 03/08/2013  . Back pain     Patient Active Problem List   Diagnosis Date Noted  . Depression with anxiety 05/20/2018  . Pleural effusion on right 05/20/2018  . Mass of upper lobe of right lung 05/20/2018  . Hypokalemia 05/20/2018  . CAP (community acquired pneumonia) 05/20/2018  . Prolonged QT interval 05/20/2018  . Pneumonia 05/20/2018  . Chronic diastolic CHF (congestive heart failure) ([place]) 05/20/2018  . History of hepatitis C 05/12/2017  . Dyspnea 05/13/2016  . 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 Chronic pain syndrome   . Malnutrition of moderate degree ([place]) 03/10/2013    Past Surgical History:  Procedure Laterality Date  . LEG SURGERY     left femur fx        Home Medications    Prior to Admission medications   Medication Sig Start Date End Date Taking? Authorizing Provider  benztropine (COGENTIN) 1 MG tablet Take 1 mg by mouth 2 (two) times daily. 01/06/18  Yes [provider]  cyclobenzaprine (FLEXERIL) 10 MG tablet Take 10 mg by mouth 3 (three) times daily as needed for muscle spasms.  11/19/17  Yes [provider]  DULoxetine (CYMBALTA) 60 MG  capsule Take 60 mg by mouth daily. 07/13/14  Yes [provider]  furosemide (LASIX) 20 MG tablet Take 20 mg by mouth as needed. swelling 05/03/18  Yes [provider]  OLANZapine (ZYPREXA) 15 MG tablet Take 45 mg by mouth at bedtime. 04/25/18  Yes [provider]  Oxcarbazepine (TRILEPTAL) 300 MG tablet Take 300 mg by mouth 2 (two) times daily. 01/06/18  Yes [provider]  oxymorphone (OPANA) 10 MG tablet Take 10 mg by mouth every 8 (eight) hours. 01/15/18  Yes [provider]  traZODone (DESYREL) 100 MG tablet Take 100 mg by mouth at bedtime. 04/20/18  Yes [provider]  albuterol (PROAIR HFA) 108 (90 Base) MCG/ACT inhaler 2 puffs every 4 hours as needed only  if your can't catch your breath Patient not taking: Reported on 01/22/2018 04/03/16   Tanda Rockers, MD    Family History Family History  Problem Relation Age of Onset  . Hypertension Mother   . Heart failure Mother     Social History Social History   Tobacco Use  . Smoking status: Current Every Day Smoker    Packs/day: 1.00    Years: 50.00    Pack years: 50.00    Types: E-cigarettes, Cigarettes    Last attempt to quit: 08/25/2014    Years since quitting: 3.7  . Smokeless tobacco:  Never Used  Substance Use Topics  . Alcohol use: Yes    Comment: occasionally  . Drug use: Not Currently    Types: Cocaine, Marijuana    Comment: hx cocaine years ago     Allergies   Penicillin g and Penicillins   Review of Systems Review of Systems  Constitutional: Negative for chills and fever.  HENT: Negative for ear pain and sore throat.   Eyes: Negative for pain and visual disturbance.  Respiratory: Positive for shortness of breath. Negative for cough and chest tightness.   Cardiovascular: Positive for chest pain (With deep breathing) and leg swelling. Negative for palpitations.  Gastrointestinal: Negative for abdominal pain and vomiting.  Genitourinary: Negative for dysuria and  hematuria.  Musculoskeletal: Negative for arthralgias, back pain and neck pain.  Skin: Negative for color change and rash.  Neurological: Negative for seizures, syncope and headaches.  All other systems reviewed and are negative.    Physical Exam Updated Vital Signs BP 124/79 (BP Location: Right Arm)   Pulse (!) 121 Comment: Simultaneous filing. User may not have seen previous data.  Temp 100.2 F (37.9 C) (Oral)   Resp 10 Comment: Simultaneous filing. User may not have seen previous data.  Ht 6\' 3"  (1.905 m)   Wt 90.7 kg   SpO2 95%   BMI 25.00 kg/m   Physical Exam  Constitutional: He appears well-developed and well-nourished.  Non-toxic appearance. He appears ill. No distress.  HENT:  Head: Normocephalic and atraumatic.  Mouth/Throat: Oropharynx is clear and moist.  Eyes: Conjunctivae are normal.  Neck: Neck supple.  Cardiovascular: Normal rate and regular rhythm.  No murmur heard. Pulmonary/Chest: He is in respiratory distress. He has decreased breath sounds in the right middle field and the right lower field. He has rhonchi in the right upper field, the right middle field, the left upper field and the left middle field.  Abdominal: Soft. Bowel sounds are normal. He exhibits no distension. There is no tenderness. There is no guarding.  Musculoskeletal: He exhibits no edema.  Neurological: He is alert.  Skin: Skin is warm and dry.  Psychiatric: He has a normal mood and affect.  Nursing note and vitals reviewed.    ED Treatments / Results  Labs (all labs ordered are listed, but only abnormal results are displayed) Labs Reviewed  BASIC METABOLIC PANEL - Abnormal; Notable for the following components:      Result Value   Potassium 3.3 (*)    Glucose, Bld 119 (*)    Calcium 8.4 (*)    All other components within normal limits  CBC - Abnormal; Notable for the following components:   WBC 14.1 (*)    RBC 4.16 (*)    Hemoglobin 12.1 (*)    HCT 37.4 (*)    All other  components within normal limits  HEPATIC FUNCTION PANEL - Abnormal; Notable for the following components:   Total Protein 6.4 (*)    Albumin 3.1 (*)    All other components within normal limits  CULTURE, BLOOD (ROUTINE X 2)  CULTURE, BLOOD (ROUTINE X 2)  ACID FAST SMEAR (AFB)  ACID FAST SMEAR (AFB)  ACID FAST CULTURE WITH REFLEXED SENSITIVITIES  ACID FAST CULTURE WITH REFLEXED SENSITIVITIES  EXPECTORATED SPUTUM ASSESSMENT W REFEX TO RESP CULTURE  GRAM STAIN  BRAIN NATRIURETIC PEPTIDE  LIPASE, BLOOD  ETHANOL  AMMONIA  URINALYSIS, ROUTINE W REFLEX MICROSCOPIC  RAPID URINE DRUG SCREEN, HOSP PERFORMED  STREP PNEUMONIAE URINARY ANTIGEN  BASIC METABOLIC PANEL  CBC  WITH DIFFERENTIAL/PLATELET  I-STAT TROPONIN, ED  I-STAT CG4 LACTIC ACID, ED    EKG None  Radiology Dg Chest 2 View  Result Date: 05/19/2018 CLINICAL DATA:  Dry cough and dyspnea for 2 weeks. Forty-five pack-year history of smoking. EXAM: CHEST - 2 VIEW COMPARISON:  04/03/2016 FINDINGS: New large right pleural effusion spanning 3-1/2 vertebral body heights on the lateral view is identified since prior. Some fluid drapes over the right lung apex. Vague rounded density projects over the anterior right upper lobe possibly cavitary in etiology. This measures approximately 4.9 x 4.9 x 4.5 cm. Adjacent compressive atelectasis is noted at the right lung base. Left lung remains relatively clear. The right heart border is obscured by the pleural effusion. Mild aortic atherosclerosis at the arch without aneurysm. No acute nor suspicious osseous abnormality. Degenerative changes are present the dorsal spine without aggressive osseous lesion identified. IMPRESSION: New large right pleural effusion spanning 3 in a half vertebral body heights with adjacent compressive atelectasis. Vague masslike opacity possibly cavitary in the right upper lobe anteriorly measuring 4.9 x 4.9 x 4.5 cm could potentially represent a necrotic neoplasm, thick-walled  bulla or cavitary pneumonia among some considerations. Recommend chest CT with IV contrast. Electronically Signed   By: Ashley Royalty M.D.   On: 05/19/2018 22:39    Procedures Procedures   CRITICAL CARE Performed by: Wyn Quaker Total critical care time: 30 minutes Critical care time was exclusive of separately billable procedures and treating other patients. Critical care was necessary to treat or prevent imminent or life-threatening deterioration. Critical care was time spent personally by me on the following activities: development of treatment plan with patient and/or surrogate as well as nursing, discussions with consultants, evaluation of patient's response to treatment, examination of patient, obtaining history from patient or surrogate, ordering and performing treatments and interventions, ordering and review of laboratory studies, ordering and review of radiographic studies, pulse oximetry and re-evaluation of patient's condition.   Medications Ordered in ED Medications  metroNIDAZOLE (FLAGYL) IVPB 500 mg (500 mg Intravenous New Bag/Given 05/19/18 2251)  vancomycin (VANCOCIN) 2,000 mg in sodium chloride 0.9 % 500 mL IVPB (2,000 mg Intravenous New Bag/Given 05/19/18 2250)  vancomycin (VANCOCIN) IVPB 1000 mg/200 mL premix (has no administration in time range)  aztreonam (AZACTAM) 2 g in sodium chloride 0.9 % 100 mL IVPB (has no administration in time range)  potassium chloride SA (K-DUR,KLOR-CON) CR tablet 20 mEq (has no administration in time range)  potassium chloride 10 mEq in 100 mL IVPB (has no administration in time range)  magnesium sulfate IVPB 1 g 100 mL (has no administration in time range)  morphine (MS CONTIN) 12 hr tablet 30 mg (has no administration in time range)  DULoxetine (CYMBALTA) DR capsule 60 mg (has no administration in time range)  OLANZapine (ZYPREXA) tablet 45 mg (has no administration in time range)  traZODone (DESYREL) tablet 100 mg (has no  administration in time range)  benztropine (COGENTIN) tablet 1 mg (has no administration in time range)  cyclobenzaprine (FLEXERIL) tablet 10 mg (has no administration in time range)  Oxcarbazepine (TRILEPTAL) tablet 300 mg (has no administration in time range)  sodium chloride flush (NS) 0.9 % injection 3 mL (has no administration in time range)  acetaminophen (TYLENOL) tablet 650 mg (has no administration in time range)    Or  acetaminophen (TYLENOL) suppository 650 mg (has no administration in time range)  senna-docusate (Senokot-S) tablet 1 tablet (has no administration in time range)  bisacodyl (DULCOLAX) EC tablet  5 mg (has no administration in time range)  aztreonam (AZACTAM) 2 g in sodium chloride 0.9 % 100 mL IVPB (2 g Intravenous New Bag/Given 05/19/18 2249)  iohexol (OMNIPAQUE) 300 MG/ML solution 75 mL (75 mLs Intravenous Contrast Given 05/20/18 0028)     Initial Impression / Assessment and Plan / ED Course  I have reviewed the triage vital signs and the nursing notes.  Pertinent labs & imaging results that were available during my care of the patient were reviewed by me and considered in my medical decision making (see chart for details).  Clinical Course as of May 21 47  Wed May 19, 2018  2256 Possible cavitary lesion.  TB precautions placed.   DG Chest 2 View [EH]  2300 Informed patient's RN concern for Tb.  She is Oceanographer.    [EH]  2316 Patient and family aware of possability of Tb.  Charge RN aware. Sepsis re-evaluation completed.    [EH]  Thu May 20, 2018  0023 Spoke with dr. Myna Hidalgo who will come see patient.    [EH]    Clinical Course User Index [EH] Lorin Glass, PA-C   Patient presents today for evaluation of shortness of breath.  This is been going on for multiple weeks however he has become significantly worse over the past few days.  Here he is febrile with a temp of 100.2, tachycardic with heart rate in the 120s with a new oxygen  requirement.  Code sepsis was called and patient was started on broad-spectrum antibiotics.  Blood cultures were ordered.  Patient has a new oxygen requirement of 2 L/min.  Chest x-ray was obtained showing concern for a 5 cm cavitary lesion in the right upper lung lobe with a large pleural effusion on the right side.  Patient was placed on TB precautions, charge nurse was made aware.  CT chest with contrast ordered.    Labs significant for leukocytosis of 14.1 with a hemoglobin of 12.1.  Potassium is slightly low at 3.3, creatinine is normal.  AST and ALT are not elevated.  BNP, Troponin, Ammonia and ethanol are not elevated. Given concern for lower leg edema and concern for fluid retention.    Hospitalist was consulted.  I spoke with Dr. Myna Hidalgo who will see the patient.     Final Clinical Impressions(s) / ED Diagnoses   Final diagnoses:  Cavitary lesion of lung  Hypoxia  Pleural effusion    ED Discharge Orders    None       Ollen Gross 05/20/18 0101    Julianne Rice, MD 05/23/18 1204

## 2018-05-19 NOTE — ED Notes (Signed)
Pt. Currently on 2L nasal cannula, due to low spo2.

## 2018-05-19 NOTE — ED Notes (Addendum)
Patient in Xray

## 2018-05-19 NOTE — Progress Notes (Signed)
Pharmacy Antibiotic Note  Dale [name] is a 62 y.o. male admitted on 05/19/2018 with sepsis.  Pharmacy has been consulted for vancomycin and aztreonam dosing. Pt has a PCN allergy with no documented administration of a cephalosporin here. WBC 14.1, Tmax 100.2, sCr 0.8, CrCl >100  Plan: Vancomycin 2000mg  Iv x1, then 1000mg  Q8H Aztreonam 2g Q8H Metronidazole 500mg  Q8H  Height: 6\' 3"  (190.5 cm) Weight: 200 lb (90.7 kg) IBW/kg (Calculated) : 84.5  Temp (24hrs), Avg:100.2 F (37.9 C), Min:100.2 F (37.9 C), Max:100.2 F (37.9 C)  Recent Labs  Lab 05/19/18 2051  WBC 14.1*  CREATININE 0.80    Estimated Creatinine Clearance: 114.4 mL/min (by C-G formula based on SCr of 0.8 mg/dL).    Allergies  Allergen Reactions  . Penicillin G   . Penicillins Swelling    Has patient had a PCN reaction causing immediate rash, facial/tongue/throat swelling, SOB or lightheadedness with hypotension: No Has patient had a PCN reaction causing severe rash involving mucus membranes or skin necrosis: No Has patient had a PCN reaction that required hospitalization: No Has patient had a PCN reaction occurring within the last 10 years: No If all of the above answers are "NO", then may proceed with Cephalosporin use.    Antimicrobials this admission: vanc Aztreonam flagyl  Dose adjustments this admission:  Microbiology results:   Thank you for allowing pharmacy to be a part of this patient's care.  Harrietta Guardian, PharmD PGY1 Pharmacy Resident 05/19/2018    10:25 PM

## 2018-05-20 ENCOUNTER — Encounter (HOSPITAL_COMMUNITY): Payer: Self-pay | Admitting: Family Medicine

## 2018-05-20 DIAGNOSIS — G894 Chronic pain syndrome: Secondary | ICD-10-CM | POA: Diagnosis present

## 2018-05-20 DIAGNOSIS — I5032 Chronic diastolic (congestive) heart failure: Secondary | ICD-10-CM | POA: Diagnosis present

## 2018-05-20 DIAGNOSIS — D630 Anemia in neoplastic disease: Secondary | ICD-10-CM | POA: Diagnosis present

## 2018-05-20 DIAGNOSIS — J851 Abscess of lung with pneumonia: Secondary | ICD-10-CM | POA: Diagnosis present

## 2018-05-20 DIAGNOSIS — E876 Hypokalemia: Secondary | ICD-10-CM | POA: Diagnosis present

## 2018-05-20 DIAGNOSIS — J90 Pleural effusion, not elsewhere classified: Secondary | ICD-10-CM | POA: Diagnosis present

## 2018-05-20 DIAGNOSIS — J984 Other disorders of lung: Secondary | ICD-10-CM | POA: Diagnosis not present

## 2018-05-20 DIAGNOSIS — F418 Other specified anxiety disorders: Secondary | ICD-10-CM | POA: Diagnosis present

## 2018-05-20 DIAGNOSIS — J181 Lobar pneumonia, unspecified organism: Secondary | ICD-10-CM

## 2018-05-20 DIAGNOSIS — R918 Other nonspecific abnormal finding of lung field: Secondary | ICD-10-CM | POA: Diagnosis present

## 2018-05-20 DIAGNOSIS — R590 Localized enlarged lymph nodes: Secondary | ICD-10-CM | POA: Diagnosis present

## 2018-05-20 DIAGNOSIS — Z79899 Other long term (current) drug therapy: Secondary | ICD-10-CM | POA: Diagnosis not present

## 2018-05-20 DIAGNOSIS — J9621 Acute and chronic respiratory failure with hypoxia: Secondary | ICD-10-CM | POA: Diagnosis present

## 2018-05-20 DIAGNOSIS — J440 Chronic obstructive pulmonary disease with acute lower respiratory infection: Secondary | ICD-10-CM | POA: Diagnosis present

## 2018-05-20 DIAGNOSIS — C3490 Malignant neoplasm of unspecified part of unspecified bronchus or lung: Secondary | ICD-10-CM | POA: Diagnosis present

## 2018-05-20 DIAGNOSIS — R05 Cough: Secondary | ICD-10-CM | POA: Diagnosis not present

## 2018-05-20 DIAGNOSIS — R9431 Abnormal electrocardiogram [ECG] [EKG]: Secondary | ICD-10-CM

## 2018-05-20 DIAGNOSIS — J449 Chronic obstructive pulmonary disease, unspecified: Secondary | ICD-10-CM | POA: Diagnosis not present

## 2018-05-20 DIAGNOSIS — F17210 Nicotine dependence, cigarettes, uncomplicated: Secondary | ICD-10-CM | POA: Diagnosis present

## 2018-05-20 DIAGNOSIS — B182 Chronic viral hepatitis C: Secondary | ICD-10-CM | POA: Diagnosis present

## 2018-05-20 DIAGNOSIS — J189 Pneumonia, unspecified organism: Secondary | ICD-10-CM | POA: Diagnosis present

## 2018-05-20 DIAGNOSIS — F319 Bipolar disorder, unspecified: Secondary | ICD-10-CM | POA: Diagnosis present

## 2018-05-20 LAB — BASIC METABOLIC PANEL
ANION GAP: 8 (ref 5–15)
BUN: 9 mg/dL (ref 8–23)
CALCIUM: 8.3 mg/dL — AB (ref 8.9–10.3)
CO2: 27 mmol/L (ref 22–32)
CREATININE: 0.71 mg/dL (ref 0.61–1.24)
Chloride: 103 mmol/L (ref 98–111)
GFR calc non Af Amer: >60 mL/min (ref >60)
Glucose, Bld: 111 mg/dL — ABNORMAL HIGH (ref 70–99)
Potassium: 3.9 mmol/L (ref 3.5–5.1)
SODIUM: 138 mmol/L (ref 135–145)

## 2018-05-20 LAB — RAPID URINE DRUG SCREEN, HOSP PERFORMED
AMPHETAMINES: NOT DETECTED
Barbiturates: NOT DETECTED
Benzodiazepines: NOT DETECTED
COCAINE: NOT DETECTED
Opiates: POSITIVE — AB
Tetrahydrocannabinol: NOT DETECTED

## 2018-05-20 LAB — HEPATIC FUNCTION PANEL
ALBUMIN: 3.1 g/dL — AB (ref 3.5–5.0)
ALK PHOS: 72 U/L (ref 38–126)
ALT: 13 U/L (ref 0–44)
AST: 19 U/L (ref 15–41)
BILIRUBIN DIRECT: 0.2 mg/dL (ref 0.0–0.2)
BILIRUBIN TOTAL: 0.6 mg/dL (ref 0.3–1.2)
Indirect Bilirubin: 0.4 mg/dL (ref 0.3–0.9)
Total Protein: 6.4 g/dL — ABNORMAL LOW (ref 6.5–8.1)

## 2018-05-20 LAB — URINALYSIS, ROUTINE W REFLEX MICROSCOPIC
Bilirubin Urine: NEGATIVE
GLUCOSE, UA: NEGATIVE mg/dL
HGB URINE DIPSTICK: NEGATIVE
Ketones, ur: NEGATIVE mg/dL
Leukocytes, UA: NEGATIVE
Nitrite: NEGATIVE
PROTEIN: NEGATIVE mg/dL
Specific Gravity, Urine: 1.039 — ABNORMAL HIGH (ref 1.005–1.030)
pH: 6 (ref 5.0–8.0)

## 2018-05-20 LAB — LIPASE, BLOOD: Lipase: 19 U/L (ref 11–51)

## 2018-05-20 LAB — CBC WITH DIFFERENTIAL/PLATELET
ABS IMMATURE GRANULOCYTES: 0.1 10*3/uL (ref 0.0–0.1)
BASOS PCT: 0 %
Basophils Absolute: 0.1 10*3/uL (ref 0.0–0.1)
EOS ABS: 0.4 10*3/uL (ref 0.0–0.7)
Eosinophils Relative: 3 %
HCT: 35.8 % — ABNORMAL LOW (ref 39.0–52.0)
Hemoglobin: 11.5 g/dL — ABNORMAL LOW (ref 13.0–17.0)
IMMATURE GRANULOCYTES: 1 %
Lymphocytes Relative: 10 %
Lymphs Abs: 1.2 10*3/uL (ref 0.7–4.0)
MCH: 29 pg (ref 26.0–34.0)
MCHC: 32.1 g/dL (ref 30.0–36.0)
MCV: 90.2 fL (ref 78.0–100.0)
Monocytes Absolute: 0.8 10*3/uL (ref 0.1–1.0)
Monocytes Relative: 7 %
NEUTROS ABS: 9.4 10*3/uL — AB (ref 1.7–7.7)
NEUTROS PCT: 79 %
PLATELETS: 347 10*3/uL (ref 150–400)
RBC: 3.97 MIL/uL — ABNORMAL LOW (ref 4.22–5.81)
RDW: 12.4 % (ref 11.5–15.5)
WBC: 11.9 10*3/uL — AB (ref 4.0–10.5)

## 2018-05-20 LAB — STREP PNEUMONIAE URINARY ANTIGEN: STREP PNEUMO URINARY ANTIGEN: NEGATIVE

## 2018-05-20 LAB — BRAIN NATRIURETIC PEPTIDE: B NATRIURETIC PEPTIDE 5: 55.1 pg/mL (ref 0.0–100.0)

## 2018-05-20 MED ORDER — DULOXETINE HCL 60 MG PO CPEP
60.0000 mg | ORAL_CAPSULE | Freq: Every day | ORAL | Status: DC
  Administered 2018-05-20 – 2018-05-27 (×8): 60 mg via ORAL
  Filled 2018-05-20 (×8): qty 1

## 2018-05-20 MED ORDER — POTASSIUM CHLORIDE CRYS ER 20 MEQ PO TBCR
20.0000 meq | EXTENDED_RELEASE_TABLET | Freq: Once | ORAL | Status: AC
  Administered 2018-05-20: 20 meq via ORAL
  Filled 2018-05-20: qty 1

## 2018-05-20 MED ORDER — ACETAMINOPHEN 650 MG RE SUPP: 650.0000 mg | Freq: Four times a day (QID) | RECTAL | Status: DC | PRN

## 2018-05-20 MED ORDER — OXCARBAZEPINE 300 MG PO TABS
300.0000 mg | ORAL_TABLET | Freq: Two times a day (BID) | ORAL | Status: DC
  Administered 2018-05-20 – 2018-05-27 (×16): 300 mg via ORAL
  Filled 2018-05-20 (×18): qty 1

## 2018-05-20 MED ORDER — CYCLOBENZAPRINE HCL 10 MG PO TABS
10.0000 mg | ORAL_TABLET | Freq: Three times a day (TID) | ORAL | Status: DC | PRN
  Administered 2018-05-21 – 2018-05-24 (×3): 10 mg via ORAL
  Filled 2018-05-20 (×3): qty 1

## 2018-05-20 MED ORDER — SODIUM CHLORIDE 0.9% FLUSH
3.0000 mL | Freq: Two times a day (BID) | INTRAVENOUS | Status: DC
  Administered 2018-05-20 – 2018-05-27 (×15): 3 mL via INTRAVENOUS

## 2018-05-20 MED ORDER — IOHEXOL 300 MG/ML  SOLN
75.0000 mL | Freq: Once | INTRAMUSCULAR | Status: AC | PRN
  Administered 2018-05-20: 75 mL via INTRAVENOUS

## 2018-05-20 MED ORDER — ACETAMINOPHEN 325 MG PO TABS
650.0000 mg | ORAL_TABLET | Freq: Four times a day (QID) | ORAL | Status: DC | PRN
  Administered 2018-05-22 – 2018-05-25 (×3): 650 mg via ORAL
  Filled 2018-05-20 (×3): qty 2

## 2018-05-20 MED ORDER — TRAZODONE HCL 100 MG PO TABS
100.0000 mg | ORAL_TABLET | Freq: Every day | ORAL | Status: DC
  Administered 2018-05-20 – 2018-05-26 (×8): 100 mg via ORAL
  Filled 2018-05-20: qty 2
  Filled 2018-05-20 (×7): qty 1

## 2018-05-20 MED ORDER — BISACODYL 5 MG PO TBEC: 5.0000 mg | DELAYED_RELEASE_TABLET | Freq: Every day | ORAL | Status: DC | PRN

## 2018-05-20 MED ORDER — POTASSIUM CHLORIDE 10 MEQ/100ML IV SOLN
10.0000 meq | INTRAVENOUS | Status: AC
  Administered 2018-05-20 (×2): 10 meq via INTRAVENOUS
  Filled 2018-05-20 (×2): qty 100

## 2018-05-20 MED ORDER — BENZTROPINE MESYLATE 1 MG PO TABS
1.0000 mg | ORAL_TABLET | Freq: Two times a day (BID) | ORAL | Status: DC
  Administered 2018-05-20 – 2018-05-27 (×16): 1 mg via ORAL
  Filled 2018-05-20: qty 2
  Filled 2018-05-20: qty 1
  Filled 2018-05-20: qty 2
  Filled 2018-05-20 (×13): qty 1

## 2018-05-20 MED ORDER — MAGNESIUM SULFATE IN D5W 1-5 GM/100ML-% IV SOLN
1.0000 g | Freq: Once | INTRAVENOUS | Status: AC
  Administered 2018-05-20: 1 g via INTRAVENOUS
  Filled 2018-05-20: qty 100

## 2018-05-20 MED ORDER — OLANZAPINE 5 MG PO TABS
45.0000 mg | ORAL_TABLET | Freq: Every day | ORAL | Status: DC
  Administered 2018-05-20 – 2018-05-26 (×8): 45 mg via ORAL
  Filled 2018-05-20 (×10): qty 1

## 2018-05-20 MED ORDER — SENNOSIDES-DOCUSATE SODIUM 8.6-50 MG PO TABS
1.0000 | ORAL_TABLET | Freq: Every evening | ORAL | Status: DC | PRN
  Administered 2018-05-24: 1 via ORAL
  Filled 2018-05-20 (×2): qty 1

## 2018-05-20 MED ORDER — ALBUTEROL SULFATE (2.5 MG/3ML) 0.083% IN NEBU: 2.5000 mg | INHALATION_SOLUTION | Freq: Four times a day (QID) | RESPIRATORY_TRACT | Status: DC | PRN

## 2018-05-20 MED ORDER — MORPHINE SULFATE ER 15 MG PO TBCR
30.0000 mg | EXTENDED_RELEASE_TABLET | Freq: Two times a day (BID) | ORAL | Status: DC
  Administered 2018-05-20 – 2018-05-27 (×16): 30 mg via ORAL
  Filled 2018-05-20 (×10): qty 2
  Filled 2018-05-20: qty 1
  Filled 2018-05-20 (×5): qty 2

## 2018-05-20 MED ORDER — TUBERCULIN PPD 5 UNIT/0.1ML ID SOLN
5.0000 [IU] | Freq: Once | INTRADERMAL | Status: AC
  Administered 2018-05-20: 5 [IU] via INTRADERMAL
  Filled 2018-05-20 (×2): qty 0.1

## 2018-05-20 NOTE — ED Notes (Signed)
Patient informed a rectal temp is the most accurate way to check his temperature and he refuses at this time. States "the only way you are going to get my temperature is in my mouth".

## 2018-05-20 NOTE — ED Notes (Addendum)
Attempted report x1. 

## 2018-05-20 NOTE — H&P (Signed)
History and Physical    Dale [name] VOH:[id number] DOB: 02-Apr-1956 DOA: 05/19/2018  PCP: Everardo Beals, NP   Patient coming from: Home   Chief Complaint: Cough, SOB  HPI: Dale [name] is a 62 y.o. male with medical history significant for history of hepatitis C that has been cured with minimal fibrosis, chronic diastolic CHF, chronic pain, and depression with anxiety, now presenting to the emergency department for evaluation of shortness of breath and pleuritic pain.  Patient reports that he noted the insidious development of shortness of breath couple weeks ago, has not noted any fevers or chills, has not been coughing much at all, but has been experiencing pain in the chest with deep breaths.  He denies weight loss, incarceration, alcoholism, or history of homelessness.  He does report night sweats a couple months ago.  ED Course: Upon arrival to the ED, patient is found to have a temperature 37.9 C, saturating 88% on room air, tachycardic to 130, and with stable blood pressure.  EKG features sinus tachycardia with rate 132, PACs, and QTc interval 563 ms.  Chest x-ray is concerning for new large right pleural effusion with compressive atelectasis and masslike opacity in the right upper lobe, possibly cavitary, possibly reflecting a necrotic neoplasm, thick-walled bulla, or cavitary pneumonia.  Chemistry panel is notable for mild hypokalemia and CBC features a leukocytosis to 14,100.  Lactic acid is reassuringly normal.  Blood cultures were collected, patient was placed on airborne isolation, and he was treated with vancomycin, Azactam, and Flagyl in the ED.  CT chest with contrast is pending.  Patient remains hemodynamically stable with resolution of tachycardia and will be admitted for ongoing evaluation and management.  All  Review of Systems:  All other systems reviewed and apart from HPI, are negative.  Past Medical History:  Diagnosis Date  . Acute renal failure ([place]) 03/08/2013  .  Back pain     Past Surgical History:  Procedure Laterality Date  . LEG SURGERY     left femur fx     reports that he has been smoking e-cigarettes and cigarettes. He has a 50.00 pack-year smoking history. He has never used smokeless tobacco. He reports that he drinks alcohol. He reports that he has current or past drug history. Drugs: Cocaine and Marijuana.  Allergies  Allergen Reactions  . Penicillin G   . Penicillins Swelling    Has patient had a PCN reaction causing immediate rash, facial/tongue/throat swelling, SOB or lightheadedness with hypotension: No Has patient had a PCN reaction causing severe rash involving mucus membranes or skin necrosis: No Has patient had a PCN reaction that required hospitalization: No Has patient had a PCN reaction occurring within the last 10 years: No If all of the above answers are "NO", then may proceed with Cephalosporin use.    Family History  Problem Relation Age of Onset  . Hypertension Mother   . Heart failure Mother      Prior to Admission medications   Medication Sig Start Date End Date Taking? Authorizing Provider  benztropine (COGENTIN) 1 MG tablet Take 1 mg by mouth 2 (two) times daily. 01/06/18  Yes [provider]  cyclobenzaprine (FLEXERIL) 10 MG tablet Take 10 mg by mouth 3 (three) times daily as needed for muscle spasms.  11/19/17  Yes [provider]  DULoxetine (CYMBALTA) 60 MG capsule Take 60 mg by mouth daily. 07/13/14  Yes [provider]  furosemide (LASIX) 20 MG tablet Take 20 mg by mouth as needed. swelling  05/03/18  Yes [provider]  OLANZapine (ZYPREXA) 15 MG tablet Take 45 mg by mouth at bedtime. 04/25/18  Yes [provider]  Oxcarbazepine (TRILEPTAL) 300 MG tablet Take 300 mg by mouth 2 (two) times daily. 01/06/18  Yes [provider]  oxymorphone (OPANA) 10 MG tablet Take 10 mg by mouth every 8 (eight) hours. 01/15/18  Yes [provider]  traZODone  (DESYREL) 100 MG tablet Take 100 mg by mouth at bedtime. 04/20/18  Yes [provider]  albuterol (PROAIR HFA) 108 (90 Base) MCG/ACT inhaler 2 puffs every 4 hours as needed only  if your can't catch your breath Patient not taking: Reported on 01/22/2018 04/03/16   Tanda Rockers, MD    Physical Exam: Vitals:   05/19/18 2048 05/19/18 2049 05/19/18 2130 05/19/18 2132  BP: 130/89  124/79   Pulse: (!) 131  (!) 121   Resp: 19  10   Temp: 100.2 F (37.9 C)     TempSrc: Oral     SpO2: 90%  96% 95%  Weight:  90.7 kg    Height:  6\' 3"  (1.905 m)       Constitutional: NAD, calm, appears frail  Eyes: PERTLA, lids and conjunctivae normal ENMT: Mucous membranes are moist. Posterior pharynx clear of any exudate or lesions.   Neck: normal, supple, no masses, no thyromegaly Respiratory: Breath sounds markedly diminished on right. No accessory muscle use.  Cardiovascular: Rate ~110 and regular. 1+ pretibial edema bilaterally. Abdomen: No distension, no tenderness, soft. Bowel sounds normal.  Musculoskeletal: no clubbing / cyanosis. No joint deformity upper and lower extremities.    Skin: no significant rashes, lesions, ulcers. Warm, dry, well-perfused. Neurologic: CN 2-12 grossly intact. Sensation intact. Strength 5/5 in all 4 limbs.  Psychiatric: Alert and oriented x 3. Disagreeable, but ultimately cooperative.    Labs on Admission: I have personally reviewed following labs and imaging studies  CBC: Recent Labs  Lab 05/19/18 2051  WBC 14.1*  HGB 12.1*  HCT 37.4*  MCV 89.9  PLT 938   Basic Metabolic Panel: Recent Labs  Lab 05/19/18 2051  NA 137  K 3.3*  CL 101  CO2 24  GLUCOSE 119*  BUN 11  CREATININE 0.80  CALCIUM 8.4*   GFR: Estimated Creatinine Clearance: 114.4 mL/min (by C-G formula based on SCr of 0.8 mg/dL). Liver Function Tests: Recent Labs  Lab 05/19/18 2246  AST 19  ALT 13  ALKPHOS 72  BILITOT 0.6  PROT 6.4*  ALBUMIN 3.1*   Recent Labs  Lab  05/19/18 2246  LIPASE 19   Recent Labs  Lab 05/19/18 2246  AMMONIA 27   Coagulation Profile: No results for input(s): INR, PROTIME in the last 168 hours. Cardiac Enzymes: No results for input(s): CKTOTAL, CKMB, CKMBINDEX, TROPONINI in the last 168 hours. BNP (last 3 results) No results for input(s): PROBNP in the last 8760 hours. HbA1C: No results for input(s): HGBA1C in the last 72 hours. CBG: No results for input(s): GLUCAP in the last 168 hours. Lipid Profile: No results for input(s): CHOL, HDL, LDLCALC, TRIG, CHOLHDL, LDLDIRECT in the last 72 hours. Thyroid Function Tests: No results for input(s): TSH, T4TOTAL, FREET4, T3FREE, THYROIDAB in the last 72 hours. Anemia Panel: No results for input(s): VITAMINB12, FOLATE, FERRITIN, TIBC, IRON, RETICCTPCT in the last 72 hours. Urine analysis:    Component Value Date/Time   COLORURINE YELLOW 01/22/2018 1330   APPEARANCEUR CLEAR 01/22/2018 1330   LABSPEC 1.027 01/22/2018 1330   PHURINE  5.0 01/22/2018 1330   GLUCOSEU NEGATIVE 01/22/2018 1330   HGBUR NEGATIVE 01/22/2018 1330   BILIRUBINUR NEGATIVE 01/22/2018 1330   KETONESUR 20 (A) 01/22/2018 1330   PROTEINUR NEGATIVE 01/22/2018 1330   UROBILINOGEN 1.0 07/22/2014 0753   NITRITE NEGATIVE 01/22/2018 1330   LEUKOCYTESUR NEGATIVE 01/22/2018 1330   Sepsis Labs: @LABRCNTIP (procalcitonin:4,lacticidven:4) )No results found for this or any previous visit (from the past 240 hour(s)).   Radiological Exams on Admission: Dg Chest 2 View  Result Date: 05/19/2018 CLINICAL DATA:  Dry cough and dyspnea for 2 weeks. Forty-five pack-year history of smoking. EXAM: CHEST - 2 VIEW COMPARISON:  04/03/2016 FINDINGS: New large right pleural effusion spanning 3-1/2 vertebral body heights on the lateral view is identified since prior. Some fluid drapes over the right lung apex. Vague rounded density projects over the anterior right upper lobe possibly cavitary in etiology. This measures approximately  4.9 x 4.9 x 4.5 cm. Adjacent compressive atelectasis is noted at the right lung base. Left lung remains relatively clear. The right heart border is obscured by the pleural effusion. Mild aortic atherosclerosis at the arch without aneurysm. No acute nor suspicious osseous abnormality. Degenerative changes are present the dorsal spine without aggressive osseous lesion identified. IMPRESSION: New large right pleural effusion spanning 3 in a half vertebral body heights with adjacent compressive atelectasis. Vague masslike opacity possibly cavitary in the right upper lobe anteriorly measuring 4.9 x 4.9 x 4.5 cm could potentially represent a necrotic neoplasm, thick-walled bulla or cavitary pneumonia among some considerations. Recommend chest CT with IV contrast. Electronically Signed   By: Ashley Royalty M.D.   On: 05/19/2018 22:39    EKG: Independently reviewed. Sinus tachycardia (rate 132), PAC's, QTc 563 ms.   Assessment/Plan   1. Right lung mass with large right pleural effusion  - Presents with SOB and cough - CXR reveals large right pleural effusion and masslike opacity in RUL concerning for cavitary PNA or necrotic neoplasm   - CT is more suggestive of neoplasia with malignant effusion  - Blood cultures collected in ED, airborne precautions started, and he was treated with vancomycin, aztreonam, and Flagyl  - Plan to continue current abx for now, check sputum AFB, continue airborne precautions for now, consult with IR for thoracentesis with cytology, gram stain, AFB smear, and cultures    2. Prolonged QT interval   - QTc is 563 ms on admission EKG  - Replace potassium, avoid QT-prolonging medications, continue cardiac monitoring for now, and repeat EKG in am    3. Chronic diastolic CHF   - Preserved EF with grade 1 diastolic dysfunction on remote echo  - Bilateral LE edema noted  - SLIV, follow daily wt and I/O's    4. Hypokalemia  - Serum potassium is 3.3 on admission with prolonged QT  interval  - Treated with 20 mEq oral and 20 mEq IV potassium as well as empiric magnesium  - Repeat chem panel in am    5. Depression with anxiety  - Continue current regimen with Cymbalta, trazodone, Zyprexa   6. Chronic pain  - Continue Cymbalta and Flexeril as needed - Replace Opana with dose-equivalent morphine while in hospital     DVT prophylaxis: SCD's  Code Status: Full  Family Communication: Discussed with patient  Consults called: None  Admission status: Inpatient    Vianne Bulls, MD [facility] Pager [phone]  If 7PM-7AM, please contact night-coverage [email or web address] Password [facility]  05/20/2018, 12:49 AM

## 2018-05-20 NOTE — Progress Notes (Addendum)
PROGRESS NOTE    Dale [name]  XTG:[id number] DOB: 1956-03-23 DOA: 05/19/2018 PCP: Dale Beals, NP      Brief Narrative:  Dale [name] is a 63 y.o. M with dCHF, hep C s/p treatment and chronic pain who presented for pleuritic chest pain and dyspnea.  In the ER, found to have hypoxia, tachycardia and CT showed cavitary lung lesion, pneumonia vs malignancy.  Started on empiric antibiotics for pneumonia, lung abscess and admitted.      Assessment & Plan:  Pneumonia right upper lobe, cavitary Pulmonary abscess HIV negative.  Uses opiates, not clear if he uses alcohol.  Active smoker. -Continue vancomycin, Azactam, and Flagyl  Given UL location, cavitary, will rule out TB -Airborne precs -AFB smears in AM x3, RT induced -Check quant gold  -TB skin test placed   Right pleural effusion -Thoracentesis ordered -Cell counts, cytology, culture of pleural fluid  Chronic diastolic CHF No clear fluid overload. -Hold Lasix   Depression Chronic pain -Continue Oxymorphone as Morphine -Continue benztropine, trazodone, Trileptal, Olanxapine, Cymbalta  Hypokalemia Repleted  Anemia, unclear cause -Check iron studies    DVT prophylaxis:   Code Status:   Family Communication:  MDM and disposition Plan: This is a no charge note.  For further details, please see H&P by my partner Dr. Myna Hidalgo from earlier today.  The below labs and imaging reports were reviewed and summarized above.     Subjective:  Dyspnea, chest pain.     Objective: Vitals:   05/20/18 0200 05/20/18 0342 05/20/18 0800 05/20/18 1110  BP: 131/85 128/82  124/84  Pulse: 95 98 100 98  Resp: (!) 24 (!) 21  18  Temp:  97.9 F (36.6 C)  97.9 F (36.6 C)  TempSrc:  Oral  Oral  SpO2: 98% 95% 97% 97%  Weight:  91 kg    Height:        Intake/Output Summary (Last 24 hours) at 05/20/2018 1307 Last data filed at 05/20/2018 0800 Gross per 24 hour  Intake 1395.18 ml  Output -  Net 1395.18 ml   Filed Weights     05/19/18 2049 05/20/18 0342  Weight: 90.7 kg 91 kg    Examination: The patient was seen and examined.      Data Reviewed: I have personally reviewed following labs and imaging studies:  CBC: Recent Labs  Lab 05/19/18 2051 05/20/18 0348  WBC 14.1* 11.9*  NEUTROABS  --  9.4*  HGB 12.1* 11.5*  HCT 37.4* 35.8*  MCV 89.9 90.2  PLT 368 270   Basic Metabolic Panel: Recent Labs  Lab 05/19/18 2051 05/20/18 0348  NA 137 138  K 3.3* 3.9  CL 101 103  CO2 24 27  GLUCOSE 119* 111*  BUN 11 9  CREATININE 0.80 0.71  CALCIUM 8.4* 8.3*   GFR: Estimated Creatinine Clearance: 114.4 mL/min (by C-G formula based on SCr of 0.71 mg/dL). Liver Function Tests: Recent Labs  Lab 05/19/18 2246  AST 19  ALT 13  ALKPHOS 72  BILITOT 0.6  PROT 6.4*  ALBUMIN 3.1*   Recent Labs  Lab 05/19/18 2246  LIPASE 19   Recent Labs  Lab 05/19/18 2246  AMMONIA 27   Coagulation Profile: No results for input(s): INR, PROTIME in the last 168 hours. Cardiac Enzymes: No results for input(s): CKTOTAL, CKMB, CKMBINDEX, TROPONINI in the last 168 hours. BNP (last 3 results) No results for input(s): PROBNP in the last 8760 hours. HbA1C: No results for input(s): HGBA1C in the last 72 hours. CBG:  No results for input(s): GLUCAP in the last 168 hours. Lipid Profile: No results for input(s): CHOL, HDL, LDLCALC, TRIG, CHOLHDL, LDLDIRECT in the last 72 hours. Thyroid Function Tests: No results for input(s): TSH, T4TOTAL, FREET4, T3FREE, THYROIDAB in the last 72 hours. Anemia Panel: No results for input(s): VITAMINB12, FOLATE, FERRITIN, TIBC, IRON, RETICCTPCT in the last 72 hours. Urine analysis:    Component Value Date/Time   COLORURINE YELLOW 05/20/2018 0317   APPEARANCEUR CLEAR 05/20/2018 0317   LABSPEC 1.039 (H) 05/20/2018 0317   PHURINE 6.0 05/20/2018 0317   GLUCOSEU NEGATIVE 05/20/2018 0317   HGBUR NEGATIVE 05/20/2018 0317   BILIRUBINUR NEGATIVE 05/20/2018 0317   KETONESUR NEGATIVE  05/20/2018 0317   PROTEINUR NEGATIVE 05/20/2018 0317   UROBILINOGEN 1.0 07/22/2014 0753   NITRITE NEGATIVE 05/20/2018 0317   LEUKOCYTESUR NEGATIVE 05/20/2018 0317   Sepsis Labs: @LABRCNTIP (procalcitonin:4,lacticacidven:4)  ) Recent Results (from the past 240 hour(s))  Culture, blood (routine x 2)     Status: None (Preliminary result)   Collection Time: 05/19/18 10:43 PM  Result Value Ref Range Status   Specimen Description BLOOD LEFT FOREARM  Final   Special Requests   Final    BOTTLES DRAWN AEROBIC AND ANAEROBIC Blood Culture adequate volume   Culture   Final    NO GROWTH < 12 HOURS Performed at [facility], [place] [address], [place], [place] [address]    Report Status PENDING  Incomplete  Culture, blood (routine x 2)     Status: None (Preliminary result)   Collection Time: 05/19/18 10:44 PM  Result Value Ref Range Status   Specimen Description BLOOD RIGHT FOREARM  Final   Special Requests   Final    BOTTLES DRAWN AEROBIC AND ANAEROBIC Blood Culture results may not be optimal due to an inadequate volume of blood received in culture bottles   Culture   Final    NO GROWTH < 12 HOURS Performed at [facility], [place] [address], [place],  [address]    Report Status PENDING  Incomplete         Radiology Studies: Dg Chest 2 View  Result Date: 05/19/2018 CLINICAL DATA:  Dry cough and dyspnea for 2 weeks. Forty-five pack-year history of smoking. EXAM: CHEST - 2 VIEW COMPARISON:  04/03/2016 FINDINGS: New large right pleural effusion spanning 3-1/2 vertebral body heights on the lateral view is identified since prior. Some fluid drapes over the right lung apex. Vague rounded density projects over the anterior right upper lobe possibly cavitary in etiology. This measures approximately 4.9 x 4.9 x 4.5 cm. Adjacent compressive atelectasis is noted at the right lung base. Left lung remains relatively clear. The right heart border is obscured by the pleural effusion. Mild  aortic atherosclerosis at the arch without aneurysm. No acute nor suspicious osseous abnormality. Degenerative changes are present the dorsal spine without aggressive osseous lesion identified. IMPRESSION: New large right pleural effusion spanning 3 in a half vertebral body heights with adjacent compressive atelectasis. Vague masslike opacity possibly cavitary in the right upper lobe anteriorly measuring 4.9 x 4.9 x 4.5 cm could potentially represent a necrotic neoplasm, thick-walled bulla or cavitary pneumonia among some considerations. Recommend chest CT with IV contrast. Electronically Signed   By: Ashley Royalty M.D.   On: 05/19/2018 22:39   Ct Chest W Contrast  Result Date: 05/20/2018 CLINICAL DATA:  62 year old male with shortness of breath. Pleural effusion. History of COPD and smoking. EXAM: CT CHEST WITH CONTRAST TECHNIQUE: Multidetector CT imaging of  the chest was performed during intravenous contrast administration. CONTRAST:  44mL OMNIPAQUE IOHEXOL 300 MG/ML  SOLN COMPARISON:  Chest radiograph dated 05/19/2018 and CT dated 07/26/2014 FINDINGS: Cardiovascular: There is no cardiomegaly. Small pericardial effusion measuring 7 mm in thickness. There is multi vessel coronary vascular calcification. The thoracic aorta is unremarkable. Evaluation of the pulmonary arteries is very limited due to suboptimal opacification. The main pulmonary trunk and central pulmonary arteries are patent. Mediastinum/Nodes: Multiple mediastinal adenopathy as well as right hilar adenopathy including an 18 mm subcarinal and a 19 mm right suprahilar lymph node in short axis. There is an enlarged lymph node to the right of the upper esophagus in the upper mediastinum (series 3, image 51). Lungs/Pleura: There is a large right-sided pleural effusion. There is near complete collapse and consolidative changes of the right middle lobe and majority of right lower lobe. There is diffuse thickening and nodularity of the right pleural  surface consistent with pleural metastatic disease. There is a 2.0 x 4.1 cm pleural based mass in the right upper lobe anteriorly. Patchy area of ground-glass density in the left upper lobe as well as lingula and left lower lobe noted which may be infectious or inflammatory. There is no pleural effusion on the left. No pneumothorax. The central airways are patent. Upper Abdomen: There are 2 adjacent nodular densities in the right upper abdomen measuring up to 7 mm, indeterminate, possibly metastatic disease. Partially visualized 4.7 cm cystic structure in the right upper quadrant, likely a renal cyst. Musculoskeletal: No acute osseous pathology. No suspicious osseous lesions. IMPRESSION: 1. 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 There is associated compressive atelectasis of the majority of the right middle and right lower lobe. 2. Diffuse thickening and nodularity of the right pleural surface consistent with pleural passes. 3. Right upper lobe pleural based mass. 4. Right hilar and mediastinal adenopathy. 5. Patchy areas of hazy ground-glass density in the left lung, likely inflammatory/infectious. 6. Two adjacent small nodules in the right upper abdomen, indeterminate, possibly metastatic disease. Electronically Signed   By: Anner Crete M.D.   On: 05/20/2018 01:16        Scheduled Meds: . benztropine  1 mg Oral BID  . DULoxetine  60 mg Oral Daily  . morphine  30 mg Oral Q12H  . OLANZapine  45 mg Oral QHS  . Oxcarbazepine  300 mg Oral BID  . sodium chloride flush  3 mL Intravenous Q12H  . traZODone  100 mg Oral QHS  . tuberculin  5 Units Intradermal Once   Continuous Infusions: . aztreonam Stopped (05/20/18 0553)  . metronidazole Stopped (05/20/18 0526)  . vancomycin Stopped (05/20/18 0740)     LOS: 0 days    Time spent: 40 minutes    Edwin Dada, MD [facility] 05/20/2018, 1:07 PM     Pager [phone] --- please page though  AMION:  [email or web address] Password [email or web address] If 7PM-7AM, please contact night-coverage

## 2018-05-21 ENCOUNTER — Inpatient Hospital Stay (HOSPITAL_COMMUNITY): Payer: Medicare Other

## 2018-05-21 DIAGNOSIS — R918 Other nonspecific abnormal finding of lung field: Secondary | ICD-10-CM

## 2018-05-21 DIAGNOSIS — J90 Pleural effusion, not elsewhere classified: Secondary | ICD-10-CM

## 2018-05-21 DIAGNOSIS — J449 Chronic obstructive pulmonary disease, unspecified: Secondary | ICD-10-CM

## 2018-05-21 LAB — CBC
HEMATOCRIT: 33.4 % — AB (ref 39.0–52.0)
Hemoglobin: 10.7 g/dL — ABNORMAL LOW (ref 13.0–17.0)
MCH: 29.2 pg (ref 26.0–34.0)
MCHC: 32 g/dL (ref 30.0–36.0)
MCV: 91.3 fL (ref 78.0–100.0)
Platelets: 324 10*3/uL (ref 150–400)
RBC: 3.66 MIL/uL — ABNORMAL LOW (ref 4.22–5.81)
RDW: 12.8 % (ref 11.5–15.5)
WBC: 10.6 10*3/uL — ABNORMAL HIGH (ref 4.0–10.5)

## 2018-05-21 LAB — PROTEIN, PLEURAL OR PERITONEAL FLUID: TOTAL PROTEIN, FLUID: 4.4 g/dL

## 2018-05-21 LAB — BODY FLUID CELL COUNT WITH DIFFERENTIAL
Eos, Fluid: 45 %
LYMPHS FL: 18 %
Monocyte-Macrophage-Serous Fluid: 28 % — ABNORMAL LOW (ref 50–90)
NEUTROPHIL FLUID: 8 % (ref 0–25)
Total Nucleated Cell Count, Fluid: 430 cu mm (ref 0–1000)

## 2018-05-21 LAB — BASIC METABOLIC PANEL
Anion gap: 7 (ref 5–15)
BUN: 8 mg/dL (ref 8–23)
CHLORIDE: 104 mmol/L (ref 98–111)
CO2: 27 mmol/L (ref 22–32)
Calcium: 7.9 mg/dL — ABNORMAL LOW (ref 8.9–10.3)
Creatinine, Ser: 0.61 mg/dL (ref 0.61–1.24)
GFR calc Af Amer: >60 mL/min (ref >60)
GFR calc non Af Amer: >60 mL/min (ref >60)
Glucose, Bld: 123 mg/dL — ABNORMAL HIGH (ref 70–99)
POTASSIUM: 4.1 mmol/L (ref 3.5–5.1)
SODIUM: 138 mmol/L (ref 135–145)

## 2018-05-21 LAB — GRAM STAIN: Gram Stain: NONE SEEN

## 2018-05-21 LAB — LACTATE DEHYDROGENASE, PLEURAL OR PERITONEAL FLUID: LD, Fluid: 430 U/L — ABNORMAL HIGH (ref 3–23)

## 2018-05-21 LAB — GLUCOSE, PLEURAL OR PERITONEAL FLUID: Glucose, Fluid: 88 mg/dL

## 2018-05-21 LAB — PROCALCITONIN: Procalcitonin: <0.1 ng/mL

## 2018-05-21 MED ORDER — LIDOCAINE HCL (PF) 1 % IJ SOLN
INTRAMUSCULAR | Status: AC
  Administered 2018-05-21: 11:00:00
  Filled 2018-05-21: qty 30

## 2018-05-21 MED ORDER — OXYCODONE HCL 5 MG PO TABS
5.0000 mg | ORAL_TABLET | Freq: Four times a day (QID) | ORAL | Status: DC | PRN
  Administered 2018-05-21 – 2018-05-23 (×5): 5 mg via ORAL
  Filled 2018-05-21 (×5): qty 1

## 2018-05-21 NOTE — Progress Notes (Signed)
Pt's son(Blake) called and updated on pt's progress and procedure time. Son voiced appreciation; RN to update for any changes. IR currently in room doing Thorocentesis.

## 2018-05-21 NOTE — Progress Notes (Signed)
Procalcitonin negative >> will d/c ABx.  Initial pleural fluid analysis shows exudate, normal glucose, prominent eosinophils, and some mesothelial cells.  Primary concern still for malignancy.  Await cytology results.  Chesley Mires, MD [facility] Pulmonary/Critical Care 05/21/2018, 2:10 PM

## 2018-05-21 NOTE — Consult Note (Signed)
NAME:  Dale [name], MRN:  [id number], DOB:  [birth year], LOS: 1 ADMISSION DATE:  05/19/2018, CONSULTATION DATE:  05/21/2018 REFERRING MD:  Dr. Jonnie Finner, [facility], CHIEF COMPLAINT:  Short of breath   Brief History   62 yo male smoker with dyspnea, non productive cough and Rt pleuritic chest pain.  Found to have large Rt pleural effusion, and pleural based lung mass.  Past Medical History  COPD, Chronic Hep C  Significant Hospital Events   9/25 Admit  Consults: date of consult/date signed off & final recs:  IR 9/27 thoracentesis  Procedures (surgical and bedside):    Significant Diagnostic Tests:  CT chest 9/26 >> small pericardial effusion, atherosclerosis, borderline mediastinal LAN, large Rt effusion, consolidation/collapse RML and RLL, nodular thickening Rt pleural space, 4.1 cm pleural mass Rt upper lobe, patchy GGO LUL and lingula (reviewed by me)  Micro Data:  Blood 9/25 >> Sputum 9/25 >> Sputum ARB 9/25 >> Pneumococcal Ag 9/26 >> negative  Antimicrobials:  Vancomycin 9/25 >> Flagyl 9/25 >> Aztreonam 9/25 >>  Subjective:  Has cough.  Feels anxious.  Objective   Blood pressure 127/84, pulse (!) 111, temperature 97.7 F (36.5 C), temperature source Oral, resp. rate 18, height 6\' 3"  (1.905 m), weight 91 kg, SpO2 (!) 87 %.        Intake/Output Summary (Last 24 hours) at 05/21/2018 1003 Last data filed at 05/21/2018 6314 Gross per 24 hour  Intake 1020 ml  Output -  Net 1020 ml   Filed Weights   05/19/18 2049 05/20/18 0342  Weight: 90.7 kg 91 kg    Examination:  General - alert Eyes - pupils reactive ENT - no sinus tenderness, no stridor, edentulous Cardiac - regular rate/rhythm, no murmur Chest - decreased BS Rt lung 2/3 up, no wheeze Abdomen - soft, non tender, + bowel sounds, no hepatosplenomegaly Extremities - 1+ non pitting edema of lower legs Skin - no rashes Lymphatics - no lymphadenopathy Neuro - CN intact, normal strength, moves extremities, follows  commands Psych - normal mood and behavior   Assessment & Plan:   Rt pleural based mass, pleural studding, and large Rt effusion with hx of smoking. - concerned this could be malignancy - agree with plan for thoracentesis - send fluid for glucose, protein, LDH, cell count, cytology, gram stain with culture, AFB stain and culture, adenoside deaminase - f/u quantiferon gold - check procalcitonin >> if negative, then can d/c ABx - if pleural fluid analysis unrevealing, then will need bronchoscopy   Disposition / Summary of Today's Plan 05/21/18   F/u thoracentesis results by IR.    Diet: NPO DVT prophylaxis: SCDs GI prophylaxis: Not indicated Mobility: Activity as tolerated Code Status: Full code Family Communication: No family at bedside  Labs   CBC: Recent Labs  Lab 05/19/18 2051 05/20/18 0348 05/21/18 0221  WBC 14.1* 11.9* 10.6*  NEUTROABS  --  9.4*  --   HGB 12.1* 11.5* 10.7*  HCT 37.4* 35.8* 33.4*  MCV 89.9 90.2 91.3  PLT 368 347 970    Basic Metabolic Panel: Recent Labs  Lab 05/19/18 2051 05/20/18 0348 05/21/18 0221  NA 137 138 138  K 3.3* 3.9 4.1  CL 101 103 104  CO2 24 27 27   GLUCOSE 119* 111* 123*  BUN 11 9 8   CREATININE 0.80 0.71 0.61  CALCIUM 8.4* 8.3* 7.9*   GFR: Estimated Creatinine Clearance: 114.4 mL/min (by C-G formula based on SCr of 0.61 mg/dL). Recent Labs  Lab 05/19/18 2051 05/19/18 2301  05/20/18 0348 05/21/18 0221  WBC 14.1*  --  11.9* 10.6*  LATICACIDVEN  --  0.86  --   --     Liver Function Tests: Recent Labs  Lab 05/19/18 2246  AST 19  ALT 13  ALKPHOS 72  BILITOT 0.6  PROT 6.4*  ALBUMIN 3.1*   Recent Labs  Lab 05/19/18 2246  LIPASE 19   Recent Labs  Lab 05/19/18 2246  AMMONIA 27    ABG    Component Value Date/Time   TCO2 26 05/27/2013 2317     Coagulation Profile: No results for input(s): INR, PROTIME in the last 168 hours.  Cardiac Enzymes: No results for input(s): CKTOTAL, CKMB, CKMBINDEX,  TROPONINI in the last 168 hours.  HbA1C: Hgb A1c MFr Bld  Date/Time Value Ref Range Status  07/22/2014 01:55 AM 5.9 (H) <5.7 % Final    Comment:    (NOTE)                                                                       According to the ADA Clinical Practice Recommendations for 2011, when HbA1c is used as a screening test:  >=6.5%   Diagnostic of Diabetes Mellitus           (if abnormal result is confirmed) 5.7-6.4%   Increased risk of developing Diabetes Mellitus References:Diagnosis and Classification of Diabetes Mellitus,Diabetes SHFW,2637,85(YIFOY 1):S62-S69 and Standards of Medical Care in         Diabetes - 2011,Diabetes DXAJ,2878,67 (Suppl 1):S11-S61.     CBG: No results for input(s): GLUCAP in the last 168 hours.  Admitting History of Present Illness.   62 yo male smoker with dry cough, dyspnea, and right chest pain.  No fever, chills, sweats, weight loss.  Denies change in vision, dysphagia, hoarseness, or difficulty swallowing.  Not having sputum, hemoptysis, skin rash, gland swelling, joint swelling, or abdominal symptoms.  Has chronic lower leg swelling.  From [place].  Has lived in [place] for 6 years.  Worked as Games developer.  No hx of ETOH or IVDA.  Has pet dogs and chickens.  Had pneumonia in 1980's.  Not aware of any TB exposure.  Never in [employer]  Review of Systems:   Reviewed and negative except in HPI.  Past Medical History  He,  has a past medical history of Acute renal failure ([place]) (03/08/2013) and Back pain.   Surgical History    Past Surgical History:  Procedure Laterality Date  . LEG SURGERY     left femur fx     Social History   Social History   Socioeconomic History  . Marital status: Single    Spouse name: Not on file  . Number of children: Not on file  . Years of education: Not on file  . Highest education level: Not on file  Occupational History  . Not on file  Social Needs  . Financial resource strain: Not on file  . Food  insecurity:    Worry: Not on file    Inability: Not on file  . Transportation needs:    Medical: Not on file    Non-medical: Not on file  Tobacco Use  . Smoking status: Current Every Day Smoker    Packs/day: 1.00  Years: 50.00    Pack years: 50.00    Types: E-cigarettes, Cigarettes    Last attempt to quit: 08/25/2014    Years since quitting: 3.7  . Smokeless tobacco: Never Used  Substance and Sexual Activity  . Alcohol use: Yes    Comment: occasionally  . Drug use: Not Currently    Types: Cocaine, Marijuana    Comment: hx cocaine years ago  . Sexual activity: Yes    Partners: Female  Lifestyle  . Physical activity:    Days per week: Not on file    Minutes per session: Not on file  . Stress: Not on file  Relationships  . Social connections:    Talks on phone: Not on file    Gets together: Not on file    Attends religious service: Not on file    Active member of club or organization: Not on file    Attends meetings of clubs or organizations: Not on file    Relationship status: Not on file  . Intimate partner violence:    Fear of current or ex partner: Not on file    Emotionally abused: Not on file    Physically abused: Not on file    Forced sexual activity: Not on file  Other Topics Concern  . Not on file  Social History Narrative  . Not on file  ,  reports that he has been smoking e-cigarettes and cigarettes. He has a 50.00 pack-year smoking history. He has never used smokeless tobacco. He reports that he drinks alcohol. He reports that he has current or past drug history. Drugs: Cocaine and Marijuana.   Family History   His family history includes Heart failure in his mother; Hypertension in his mother.   Allergies Allergies  Allergen Reactions  . Penicillin G   . Penicillins Swelling    Has patient had a PCN reaction causing immediate rash, facial/tongue/throat swelling, SOB or lightheadedness with hypotension: No Has patient had a PCN reaction causing severe  rash involving mucus membranes or skin necrosis: No Has patient had a PCN reaction that required hospitalization: No Has patient had a PCN reaction occurring within the last 10 years: No If all of the above answers are "NO", then may proceed with Cephalosporin use.     Home Medications  Prior to Admission medications   Medication Sig Start Date End Date Taking? Authorizing Provider  benztropine (COGENTIN) 1 MG tablet Take 1 mg by mouth 2 (two) times daily. 01/06/18  Yes [provider]  cyclobenzaprine (FLEXERIL) 10 MG tablet Take 10 mg by mouth 3 (three) times daily as needed for muscle spasms.  11/19/17  Yes [provider]  DULoxetine (CYMBALTA) 60 MG capsule Take 60 mg by mouth daily. 07/13/14  Yes [provider]  furosemide (LASIX) 20 MG tablet Take 20 mg by mouth as needed. swelling 05/03/18  Yes [provider]  OLANZapine (ZYPREXA) 15 MG tablet Take 45 mg by mouth at bedtime. 04/25/18  Yes [provider]  Oxcarbazepine (TRILEPTAL) 300 MG tablet Take 300 mg by mouth 2 (two) times daily. 01/06/18  Yes [provider]  oxymorphone (OPANA) 10 MG tablet Take 10 mg by mouth every 8 (eight) hours. 01/15/18  Yes [provider]  traZODone (DESYREL) 100 MG tablet Take 100 mg by mouth at bedtime. 04/20/18  Yes [provider]  albuterol (PROAIR HFA) 108 (90 Base) MCG/ACT inhaler 2 puffs every 4 hours as needed only  if your can't catch your breath  Patient not taking: Reported on 01/22/2018 04/03/16   Tanda Rockers, MD     D/w Dr. Towanda Octave, MD [place] 05/21/2018, 10:21 AM

## 2018-05-21 NOTE — Progress Notes (Signed)
PROGRESS NOTE    Dale [name]  NHA:[id number] DOB: September 13, 1955 DOA: 05/19/2018 PCP: Dale Beals, NP      Brief Narrative:  [name] is a 61 y.o. M with dCHF, hep C s/p treatment and chronic pain who presented for pleuritic chest pain and dyspnea.  In the ER, found to have hypoxia, tachycardia and CT showed cavitary lung lesion, pneumonia vs malignancy.  Started on empiric antibiotics for pneumonia, lung abscess and admitted.      Assessment & Plan:  SOB/ cough/ +right upper lobe, cavitary mass vs abscess? HIV negative.  Uses opiates, not clear if he uses alcohol.  Active smoker. - remains SOB, will consult pulm team regarding lung mass/ effusion/ ?COPD/ SOB, appreciate assistance - continue vancomycin, Azactam, and Flagyl - Given UL location, cavitary, will rule out TB >> -Airborne precs -AFB smears in AM x3, RT induced -Check quant gold  -TB skin test placed   Right pleural effusion -Thoracentesis ordered, due today 10:30 per IR -Cell counts, cytology, culture of pleural fluid  Chronic diastolic CHF No clear fluid overload. -Hold Lasix   Depression/ Bipolar / chronic pain - on multiple medications -Continue MS Contin 30 bid (2/3 of Opana 10 tid) - will start also prn Oxy IR 5 mg q 6h prn recommended by pharm for breakthrough pain as he is only getting 2/3 equiv of his home Opana dose -Continue benztropine, trazodone, Trileptal, Olanxapine, Cymbalta - talking about leaving AMA per RN  Hypokalemia Repleted  Anemia, unclear cause -Check iron studies    Kelly Splinter MD [facility] pgr [phone] 01/17/2018, 9:25 AM   DVT prophylaxis: SCD's Code Status:  Full Family Communication: d/w patient Disposition: pt threatening to leave AMA, will ^ pain meds, may be undertreated. Await pulm consult/ rec's Consults: pulm/ CCM    Subjective: still SOB, cough not bad, nonprod, fevers a little better.   Objective: Vitals:   05/20/18 1531  05/20/18 2000 05/20/18 2007 05/21/18 0422  BP:  118/84  92/72  Pulse: (!) 103 (!) 111  100  Resp: (!) 23 (!) 31  20  Temp: 98.2 F (36.8 C)  98.6 F (37 C) 99 F (37.2 C)  TempSrc: Oral  Oral Axillary  SpO2: 95% 93%  97%  Weight:      Height:        Intake/Output Summary (Last 24 hours) at 05/21/2018 0927 Last data filed at 05/21/2018 0713 Gross per 24 hour  Intake 1020 ml  Output -  Net 1020 ml   Filed Weights   05/19/18 2049 05/20/18 0342  Weight: 90.7 kg 91 kg    Examination: The patient was seen and examined.      Data Reviewed: I have personally reviewed following labs and imaging studies:  CBC: Recent Labs  Lab 05/19/18 2051 05/20/18 0348 05/21/18 0221  WBC 14.1* 11.9* 10.6*  NEUTROABS  --  9.4*  --   HGB 12.1* 11.5* 10.7*  HCT 37.4* 35.8* 33.4*  MCV 89.9 90.2 91.3  PLT 368 347 600   Basic Metabolic Panel: Recent Labs  Lab 05/19/18 2051 05/20/18 0348 05/21/18 0221  NA 137 138 138  K 3.3* 3.9 4.1  CL 101 103 104  CO2 24 27 27   GLUCOSE 119* 111* 123*  BUN 11 9 8   CREATININE 0.80 0.71 0.61  CALCIUM 8.4* 8.3* 7.9*      Scheduled Meds: . benztropine  1 mg Oral BID  . DULoxetine  60 mg Oral Daily  . morphine  30 mg Oral Q12H  . OLANZapine  45 mg Oral QHS  . Oxcarbazepine  300 mg Oral BID  . sodium chloride flush  3 mL Intravenous Q12H  . traZODone  100 mg Oral QHS  . tuberculin  5 Units Intradermal Once   Continuous Infusions: . aztreonam 2 g (05/21/18 0713)  . metronidazole 500 mg (05/21/18 0705)  . vancomycin 1,000 mg (05/21/18 0811)     LOS: 1 day    Time spent: 40 minutes

## 2018-05-21 NOTE — Procedures (Signed)
PROCEDURE SUMMARY:  Successful image-guided right thoracentesis. Yielded 2 liters of amber fluid. Patient tolerated procedure well. No immediate complications.  Specimen was sent for labs. CXR ordered.  Claris Pong Louk PA-C 05/21/2018 11:57 AM

## 2018-05-22 DIAGNOSIS — R0781 Pleurodynia: Secondary | ICD-10-CM

## 2018-05-22 DIAGNOSIS — F319 Bipolar disorder, unspecified: Secondary | ICD-10-CM

## 2018-05-22 DIAGNOSIS — R05 Cough: Secondary | ICD-10-CM

## 2018-05-22 DIAGNOSIS — J949 Pleural condition, unspecified: Secondary | ICD-10-CM

## 2018-05-22 LAB — BASIC METABOLIC PANEL
Anion gap: 8 (ref 5–15)
BUN: 7 mg/dL — AB (ref 8–23)
CO2: 27 mmol/L (ref 22–32)
CREATININE: 0.58 mg/dL — AB (ref 0.61–1.24)
Calcium: 7.6 mg/dL — ABNORMAL LOW (ref 8.9–10.3)
Chloride: 102 mmol/L (ref 98–111)
GFR calc Af Amer: >60 mL/min (ref >60)
Glucose, Bld: 118 mg/dL — ABNORMAL HIGH (ref 70–99)
Potassium: 3.6 mmol/L (ref 3.5–5.1)
SODIUM: 137 mmol/L (ref 135–145)

## 2018-05-22 LAB — CBC
HCT: 33.7 % — ABNORMAL LOW (ref 39.0–52.0)
Hemoglobin: 10.7 g/dL — ABNORMAL LOW (ref 13.0–17.0)
MCH: 28.7 pg (ref 26.0–34.0)
MCHC: 31.8 g/dL (ref 30.0–36.0)
MCV: 90.3 fL (ref 78.0–100.0)
PLATELETS: 317 10*3/uL (ref 150–400)
RBC: 3.73 MIL/uL — ABNORMAL LOW (ref 4.22–5.81)
RDW: 12.5 % (ref 11.5–15.5)
WBC: 10.3 10*3/uL (ref 4.0–10.5)

## 2018-05-22 LAB — ACID FAST SMEAR (AFB, MYCOBACTERIA): Acid Fast Smear: NEGATIVE

## 2018-05-22 NOTE — Progress Notes (Signed)
PROGRESS NOTE    Dale [name]  QMV:[id number] DOB: [birth year] DOA: 05/19/2018 PCP: Everardo Beals, NP      Brief Narrative:  Dale [name] is a 62 y.o. M with dCHF, hep C s/p treatment and chronic pain who presented for pleuritic chest pain and dyspnea.  In the ER, found to have hypoxia, tachycardia and CT showed cavitary lung lesion, pneumonia vs malignancy.  Started on empiric antibiotics for pneumonia, lung abscess and admitted.     Assessment & Plan:  R Pleural based mass/ R pleural studding/ large R effusion/ hx smoking: by CT chest. Primary concern raised by CCM and by IR is malignancy.   - thoracentesis 9/27 was high in eos but otherwise transudative; cytology pending - quantiferon gold pending, sent - HIV neg - procalcitonin normal > IV abx stopped per CCM - per CCM if pleural fluid analysis is unrevealing next step might be bronch vs biopsy pleural mass -TB skin test placed ?   Right pleural effusion -- sp thora 2 liters amber fluid - transudative w/ high eos, cytology pend  Chronic diastolic CHF No clear fluid overload. -Hold Lasix   Depression/ Bipolar / chronic pain - on multiple medications -Continue MS Contin 30 bid (2/3 of Opana 10 tid) - will start also prn Oxy IR 5 mg q 6h prn recommended by pharm for breakthrough pain as he is only getting 2/3 equiv of his home Opana dose -Continue benztropine, trazodone, Trileptal, Olanxapine, Cymbalta  Hypokalemia Repleted  Anemia, unclear cause: Hb 10.7 -Check iron studies    Kelly Splinter MD [facility] pgr [phone]  01/17/2018, 9:25 AM   DVT prophylaxis: SCD's Code Status:  Full Family Communication: none here, have d/w patient at length what we are concerned about and questions answered Disposition: per CCM Consults: pulm/ CCM    Subjective: no new c/o's  Objective: Vitals:   05/21/18 1230 05/21/18 1752 05/21/18 1950 05/22/18 0830  BP: 109/80 107/75 116/77 121/84  Pulse:  (!) 102  (!) 111 84  Resp:  18 18 20   Temp:  98.4 F (36.9 C) 98.8 F (37.1 C) 98.9 F (37.2 C)  TempSrc:  Oral Oral Oral  SpO2:  93% 93% 96%  Weight:      Height:        Intake/Output Summary (Last 24 hours) at 05/22/2018 1259 Last data filed at 05/21/2018 1757 Gross per 24 hour  Intake 600 ml  Output 400 ml  Net 200 ml   Filed Weights   05/19/18 2049 05/20/18 0342  Weight: 90.7 kg 91 kg    Exam:  Alert , disheveled, pleasant today  no jvd  Chest dec'd BS bilat, dec'd R base, no wheezing  Cor reg no mrg  abd soft ntnd no ascites  ext no edema  GU normal male  NF, Ox 3   Data Reviewed: I have personally reviewed following labs and imaging studies:  CBC: Recent Labs  Lab 05/19/18 2051 05/20/18 0348 05/21/18 0221 05/22/18 0308  WBC 14.1* 11.9* 10.6* 10.3  NEUTROABS  --  9.4*  --   --   HGB 12.1* 11.5* 10.7* 10.7*  HCT 37.4* 35.8* 33.4* 33.7*  MCV 89.9 90.2 91.3 90.3  PLT 368 347 324 027   Basic Metabolic Panel: Recent Labs  Lab 05/19/18 2051 05/20/18 0348 05/21/18 0221 05/22/18 0308  NA 137 138 138 137  K 3.3* 3.9 4.1 3.6  CL 101 103 104 102  CO2 24 27 27 27   GLUCOSE 119* 111*  123* 118*  BUN 11 9 8  7*  CREATININE 0.80 0.71 0.61 0.58*  CALCIUM 8.4* 8.3* 7.9* 7.6*      Scheduled Meds: . benztropine  1 mg Oral BID  . DULoxetine  60 mg Oral Daily  . morphine  30 mg Oral Q12H  . OLANZapine  45 mg Oral QHS  . Oxcarbazepine  300 mg Oral BID  . sodium chloride flush  3 mL Intravenous Q12H  . traZODone  100 mg Oral QHS  . tuberculin  5 Units Intradermal Once   Continuous Infusions:    LOS: 2 days

## 2018-05-22 NOTE — Progress Notes (Addendum)
IR consulted for thoracentesis as well as possible lung mass biopsy. Concern noted for possible TB given cavitary lesion.   CT Chest 05/20/18 showed: 1. 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 There is associated compressive atelectasis of the majority of the right middle and right lower lobe. 2. Diffuse thickening and nodularity of the right pleural surface consistent with pleural passes. 3. Right upper lobe pleural based mass. 4. Right hilar and mediastinal adenopathy. 5. Patchy areas of hazy ground-glass density in the left lung, likely inflammatory/infectious. 6. Two adjacent small nodules in the right upper abdomen, indeterminate, possibly metastatic disease.   Patient is s/p thoracentesis yesterday with 2L removed.  Cytology pending.  Case and imaging reviewed by Dr. Barbie Banner. Pleural-based lesion is likely amenable to biopsy, however may be best performed as an outpatient once acute needs resolved and cytology results known.  Please place an outpatient order for lung biopsy at discharge.  Brynda Greathouse, MS RD PA-C

## 2018-05-22 NOTE — Progress Notes (Signed)
Pt's son, [name], upset he has no heard from a MD with updates of his "dad POC". [name] educated on pt's cognitive status being oriented/competent and MD has spoke with pt several times. Sticky note left on chart with number to reach family regarding updates.

## 2018-05-23 LAB — CBC
HEMATOCRIT: 34.7 % — AB (ref 39.0–52.0)
HEMOGLOBIN: 11 g/dL — AB (ref 13.0–17.0)
MCH: 28.6 pg (ref 26.0–34.0)
MCHC: 31.7 g/dL (ref 30.0–36.0)
MCV: 90.4 fL (ref 78.0–100.0)
Platelets: 296 10*3/uL (ref 150–400)
RBC: 3.84 MIL/uL — ABNORMAL LOW (ref 4.22–5.81)
RDW: 12.5 % (ref 11.5–15.5)
WBC: 10.6 10*3/uL — ABNORMAL HIGH (ref 4.0–10.5)

## 2018-05-23 LAB — BASIC METABOLIC PANEL
Anion gap: 9 (ref 5–15)
BUN: 9 mg/dL (ref 8–23)
CO2: 27 mmol/L (ref 22–32)
Calcium: 7.7 mg/dL — ABNORMAL LOW (ref 8.9–10.3)
Chloride: 101 mmol/L (ref 98–111)
Creatinine, Ser: 0.58 mg/dL — ABNORMAL LOW (ref 0.61–1.24)
GFR calc Af Amer: >60 mL/min (ref >60)
GFR calc non Af Amer: >60 mL/min (ref >60)
Glucose, Bld: 119 mg/dL — ABNORMAL HIGH (ref 70–99)
POTASSIUM: 3.7 mmol/L (ref 3.5–5.1)
Sodium: 137 mmol/L (ref 135–145)

## 2018-05-23 LAB — QUANTIFERON-TB GOLD PLUS: QUANTIFERON-TB GOLD PLUS: UNDETERMINED

## 2018-05-23 LAB — QUANTIFERON-TB GOLD PLUS (RQFGPL)
QUANTIFERON NIL VALUE: 0.04 [IU]/mL
QuantiFERON Mitogen Value: 0.15 IU/mL
QuantiFERON TB1 Ag Value: 0.04 IU/mL
QuantiFERON TB2 Ag Value: 0.04 IU/mL

## 2018-05-23 MED ORDER — ENOXAPARIN SODIUM 40 MG/0.4ML ~~LOC~~ SOLN
40.0000 mg | SUBCUTANEOUS | Status: DC
  Administered 2018-05-23 – 2018-05-27 (×5): 40 mg via SUBCUTANEOUS
  Filled 2018-05-23 (×5): qty 0.4

## 2018-05-23 NOTE — Progress Notes (Signed)
Procalcitonin negative >>Abx DCed   Initial pleural fluid analysis shows exudate, normal glucose, prominent eosinophils, and some mesothelial cells.  Primary concern still for malignancy.  Await cytology results.if that isnt diagnostic , need EBUS or transthoracic CT guided pleural Biopsy   A Milon Dikes, MD  [place] 05/23/2018, 10:39 AM

## 2018-05-23 NOTE — Progress Notes (Signed)
PROGRESS NOTE  Dale [name] SWF:[id number] DOB: March 19, 1956 DOA: 05/19/2018 PCP: Dale Beals, NP  HPI/Recap of past 24 hours:  [name] is a 62 y.o. M with dCHF, hep C s/p treatment and chronic pain who presented for pleuritic chest pain and dyspnea.  In the ER, found to have hypoxia, tachycardia and CT showed cavitary lung lesion, pneumonia vs malignancy.  Started on empiric antibiotics for pneumonia, lung abscess and admitted.    05/23/2018: Patient seen and examined at his bedside.  He denies hemoptysis.  Denies chest pain or dyspnea at rest.  Cytology still pending post thoracentesis with 2 L of fluid removed.   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   Depression with anxiety   Pleural effusion on right   Hypokalemia   CAP (community acquired pneumonia)   Prolonged QT interval   Pneumonia   Chronic diastolic CHF (congestive heart failure) (HCC)   Right upper lobe mass complicated by large right pleural effusion status post thoracenteses 2 L of fluid removed on 05/22/2018 Cytology still pending High suspicion for malignancy Pulmonology following If cytology inconclusive, possible EBUS versus transthoracic CT-guided pleural biopsy Maintain O2 saturation above 92% Symptomatic management HIV negative TB skin test/QuantiFERON gold pending  Chronic normocytic anemia Hemoglobin is at baseline No sign of overt bleeding Repeat CBC in the morning  Chronic anxiety/depression Continue duloxetine    Code Status: Full code  Family Communication: None at bedside  Disposition Plan: Home possibly in 1 to 2 days when pulmonology signs off.   Consultants:  Pulmonology  Procedures:  None  Antimicrobials:  None  DVT prophylaxis: Subcu Lovenox daily   Objective: Vitals:   05/22/18 1135 05/22/18 1622 05/22/18 2014 05/23/18 1019  BP: 117/83 (!) 83/62  126/80 130/90  Pulse:  (!) 104 (!) 108 (!) 112  Resp:  18 (!) 21 (!) 22  Temp:  97.9 F (36.6 C) 98.1 F (36.7 C) 98.5 F (36.9 C)  TempSrc:  Oral Oral Oral  SpO2:  97% 92% 92%  Weight:      Height:       No intake or output data in the 24 hours ending 05/23/18 1429 Filed Weights   05/19/18 2049 05/20/18 0342  Weight: 90.7 kg 91 kg    Exam:  . General: 62 y.o. year-old male well developed well nourished in no acute distress.  Alert and oriented x2. . Cardiovascular: Regular rate and rhythm with no rubs or gallops.  No thyromegaly or JVD noted.   Marland Kitchen Respiratory: Mild rales at bases with no wheezes. Good inspiratory effort. . Abdomen: Soft nontender nondistended with normal bowel sounds x4 quadrants. . Musculoskeletal: Trace lower extremity edema varicose vein. 2/4 pulses in all 4 extremities. Marland Kitchen Psychiatry: Mood is appropriate for condition and setting   Data Reviewed: CBC: Recent Labs  Lab 05/19/18 2051 05/20/18 0348 05/21/18 0221 05/22/18 0308 05/23/18 0411  WBC 14.1* 11.9* 10.6* 10.3 10.6*  NEUTROABS  --  9.4*  --   --   --   HGB 12.1* 11.5* 10.7* 10.7* 11.0*  HCT 37.4* 35.8* 33.4* 33.7* 34.7*  MCV 89.9 90.2 91.3 90.3 90.4  PLT 368 347 324 317 220   Basic Metabolic Panel: Recent Labs  Lab 05/19/18 2051 05/20/18 0348 05/21/18 0221 05/22/18 0308 05/23/18 0411  NA 137 138 138 137 137  K 3.3* 3.9 4.1 3.6 3.7  CL 101  103 104 102 101  CO2 24 27 27 27 27   GLUCOSE 119* 111* 123* 118* 119*  BUN 11 9 8  7* 9  CREATININE 0.80 0.71 0.61 0.58* 0.58*  CALCIUM 8.4* 8.3* 7.9* 7.6* 7.7*   GFR: Estimated Creatinine Clearance: 114.4 mL/min (A) (by C-G formula based on SCr of 0.58 mg/dL (L)). Liver Function Tests: Recent Labs  Lab 05/19/18 2246  AST 19  ALT 13  ALKPHOS 72  BILITOT 0.6  PROT 6.4*  ALBUMIN 3.1*   Recent Labs  Lab 05/19/18 2246  LIPASE 19   Recent Labs  Lab 05/19/18 2246  AMMONIA 27   Coagulation Profile: No results for input(s): INR,  PROTIME in the last 168 hours. Cardiac Enzymes: No results for input(s): CKTOTAL, CKMB, CKMBINDEX, TROPONINI in the last 168 hours. BNP (last 3 results) No results for input(s): PROBNP in the last 8760 hours. HbA1C: No results for input(s): HGBA1C in the last 72 hours. CBG: No results for input(s): GLUCAP in the last 168 hours. Lipid Profile: No results for input(s): CHOL, HDL, LDLCALC, TRIG, CHOLHDL, LDLDIRECT in the last 72 hours. Thyroid Function Tests: No results for input(s): TSH, T4TOTAL, FREET4, T3FREE, THYROIDAB in the last 72 hours. Anemia Panel: No results for input(s): VITAMINB12, FOLATE, FERRITIN, TIBC, IRON, RETICCTPCT in the last 72 hours. Urine analysis:    Component Value Date/Time   COLORURINE YELLOW 05/20/2018 0317   APPEARANCEUR CLEAR 05/20/2018 0317   LABSPEC 1.039 (H) 05/20/2018 0317   PHURINE 6.0 05/20/2018 0317   GLUCOSEU NEGATIVE 05/20/2018 0317   HGBUR NEGATIVE 05/20/2018 0317   BILIRUBINUR NEGATIVE 05/20/2018 0317   KETONESUR NEGATIVE 05/20/2018 0317   PROTEINUR NEGATIVE 05/20/2018 0317   UROBILINOGEN 1.0 07/22/2014 0753   NITRITE NEGATIVE 05/20/2018 0317   LEUKOCYTESUR NEGATIVE 05/20/2018 0317   Sepsis Labs: @LABRCNTIP (procalcitonin:4,lacticidven:4)  ) Recent Results (from the past 240 hour(s))  Culture, blood (routine x 2)     Status: None (Preliminary result)   Collection Time: 05/19/18 10:43 PM  Result Value Ref Range Status   Specimen Description BLOOD LEFT FOREARM  Final   Special Requests   Final    BOTTLES DRAWN AEROBIC AND ANAEROBIC Blood Culture adequate volume   Culture   Final    NO GROWTH 4 DAYS Performed at [facility], [place] [address] [place], [place] [address]    Report Status PENDING  Incomplete  Culture, blood (routine x 2)     Status: None (Preliminary result)   Collection Time: 05/19/18 10:44 PM  Result Value Ref Range Status   Specimen Description BLOOD RIGHT FOREARM  Final   Special Requests   Final    BOTTLES  DRAWN AEROBIC AND ANAEROBIC Blood Culture results may not be optimal due to an inadequate volume of blood received in culture bottles   Culture   Final    NO GROWTH 4 DAYS Performed at [facility], [place] [address], [place], [place] [address]    Report Status PENDING  Incomplete  Acid Fast Smear (AFB)     Status: None   Collection Time: 05/21/18 12:10 PM  Result Value Ref Range Status   AFB Specimen Processing Concentration  Final   Acid Fast Smear Negative  Final    Comment: (NOTE) Performed At: [facility] [place], [place] [id number] Rush Farmer MD FI:[id number]    Source (AFB) PLEURAL  Final    Comment: FLUID RIGHT Performed at [facility], [place] [address] [place], [place] [address]  Gram stain     Status: None   Collection Time: 05/21/18 12:10 PM  Result Value Ref Range Status   Specimen Description PLEURAL FLUID RIGHT  Final   Special Requests NONE  Final   Gram Stain   Final    NO WBC SEEN NO ORGANISMS SEEN Performed at [facility], [address], [place], [place] [address]    Report Status 05/21/2018 FINAL  Final  Culture, body fluid-bottle     Status: None (Preliminary result)   Collection Time: 05/21/18 12:10 PM  Result Value Ref Range Status   Specimen Description PLEURAL FLUID RIGHT  Final   Special Requests NONE  Final   Culture   Final    NO GROWTH 2 DAYS Performed at [facility], [place] [address] [place],  [address]    Report Status PENDING  Incomplete      Studies: No results found.  Scheduled Meds: . benztropine  1 mg Oral BID  . DULoxetine  60 mg Oral Daily  . morphine  30 mg Oral Q12H  . OLANZapine  45 mg Oral QHS  . Oxcarbazepine  300 mg Oral BID  . sodium chloride flush  3 mL Intravenous Q12H  . traZODone  100 mg Oral QHS    Continuous Infusions:   LOS: 3 days     Kayleen Memos, MD [facility] Pager [phone]  If 7PM-7AM, please contact  night-coverage [email or web address] Password [facility] 05/23/2018, 2:29 PM

## 2018-05-23 NOTE — Progress Notes (Signed)
RN spoke with [facility] Lab regarding Quantiferon pending result. Lab informed RN it takes 3-5 business days to get results back as they are sent out to [facility]. Results may or may not be ready by 05/25/18.

## 2018-05-24 LAB — CULTURE, BLOOD (ROUTINE X 2)
CULTURE: NO GROWTH
Culture: NO GROWTH
Special Requests: ADEQUATE

## 2018-05-24 LAB — BASIC METABOLIC PANEL
Anion gap: 7 (ref 5–15)
BUN: 8 mg/dL (ref 8–23)
CALCIUM: 7.8 mg/dL — AB (ref 8.9–10.3)
CO2: 28 mmol/L (ref 22–32)
CREATININE: 0.59 mg/dL — AB (ref 0.61–1.24)
Chloride: 104 mmol/L (ref 98–111)
GFR calc non Af Amer: >60 mL/min (ref >60)
Glucose, Bld: 117 mg/dL — ABNORMAL HIGH (ref 70–99)
Potassium: 3.9 mmol/L (ref 3.5–5.1)
SODIUM: 139 mmol/L (ref 135–145)

## 2018-05-24 LAB — CBC
HCT: 34.6 % — ABNORMAL LOW (ref 39.0–52.0)
Hemoglobin: 11 g/dL — ABNORMAL LOW (ref 13.0–17.0)
MCH: 29 pg (ref 26.0–34.0)
MCHC: 31.8 g/dL (ref 30.0–36.0)
MCV: 91.3 fL (ref 78.0–100.0)
PLATELETS: 325 10*3/uL (ref 150–400)
RBC: 3.79 MIL/uL — AB (ref 4.22–5.81)
RDW: 12.6 % (ref 11.5–15.5)
WBC: 10.4 10*3/uL (ref 4.0–10.5)

## 2018-05-24 NOTE — Progress Notes (Signed)
PROGRESS NOTE        PATIENT DETAILS Name: Dale [name] Age: 62 y.o. Sex: male Date of Birth: Sep 09, 1955 Admit Date: 05/19/2018 Admitting Physician Vianne Bulls, MD TDS:[id number], Joelene Millin, NP  Brief Narrative: Patient is a 62 y.o. male long-standing history of tobacco use, diastolic CHF, history of hep C status post treatment-presented with pleuritic chest pain and dyspnea, CT chest showed large right-sided pleural effusion, hilar and mediastinal adenopathy, right upper lobe mass-underwent thoracocentesis on 9/27-cytology pending at this point.  See below for further details  Subjective: Lying comfortably in bed if in no chest pain.  No headache.  Assessment/Plan: Right-sided pleural-based mass/large right-sided pleural effusion with hilar lymphadenopathy: High suspicion for primary lung cancer-has a long-standing history of smoking.  Underwent thoracocentesis on 9/27-awaiting cytology.  If cytology negative-then PCCM pending bronchoscopy.  I doubt this is TB-gold QuantiFERON was indeterminate. AFB x1. Remains on airborne isolation for now.  Depression/bipolar disorder: Appears to be stable-continue antipsychotics/antidepressants.  Chronic pain: Appears to be stable-continue with chronic narcotics.  DVT Prophylaxis: Prophylactic Lovenox  Code Status: Full code   Family Communication: Daughter-in-law over the phone  Disposition Plan: Remain inpatient-await pleural fluid cytology to determine next steps.  Antimicrobial agents: Anti-infectives (From admission, onward)   Start     Dose/Rate Route Frequency Ordered Stop   05/20/18 0700  vancomycin (VANCOCIN) IVPB 1000 mg/200 mL premix  Status:  Discontinued     1,000 mg 200 mL/hr over 60 Minutes Intravenous Every 8 hours 05/19/18 2230 05/21/18 1409   05/20/18 0600  aztreonam (AZACTAM) 2 g in sodium chloride 0.9 % 100 mL IVPB  Status:  Discontinued     2 g 200 mL/hr over 30 Minutes Intravenous Every 8  hours 05/19/18 2230 05/21/18 1409   05/19/18 2215  vancomycin (VANCOCIN) 2,000 mg in sodium chloride 0.9 % 500 mL IVPB     2,000 mg 250 mL/hr over 120 Minutes Intravenous  Once 05/19/18 2202 05/20/18 0139   05/19/18 2145  aztreonam (AZACTAM) 2 g in sodium chloride 0.9 % 100 mL IVPB     2 g 200 mL/hr over 30 Minutes Intravenous  Once 05/19/18 2143 05/20/18 0055   05/19/18 2145  metroNIDAZOLE (FLAGYL) IVPB 500 mg  Status:  Discontinued     500 mg 100 mL/hr over 60 Minutes Intravenous Every 8 hours 05/19/18 2143 05/21/18 1409   05/19/18 2145  vancomycin (VANCOCIN) IVPB 1000 mg/200 mL premix  Status:  Discontinued     1,000 mg 200 mL/hr over 60 Minutes Intravenous  Once 05/19/18 2143 05/19/18 2201      Procedures: 9/27>> thoracocentesis  CONSULTS:  pulmonary/intensive care  Time spent: 25- minutes-Greater than 50% of this time was spent in counseling, explanation of diagnosis, planning of further management, and coordination of care.  MEDICATIONS: Scheduled Meds: . benztropine  1 mg Oral BID  . DULoxetine  60 mg Oral Daily  . enoxaparin (LOVENOX) injection  40 mg Subcutaneous Q24H  . morphine  30 mg Oral Q12H  . OLANZapine  45 mg Oral QHS  . Oxcarbazepine  300 mg Oral BID  . sodium chloride flush  3 mL Intravenous Q12H  . traZODone  100 mg Oral QHS   Continuous Infusions: PRN Meds:.acetaminophen **OR** acetaminophen, albuterol, bisacodyl, cyclobenzaprine, oxyCODONE, senna-docusate   PHYSICAL EXAM: Vital signs: Vitals:   05/23/18 1240 05/23/18 1839 05/23/18 2334 05/24/18 0600  BP: 129/86 100/81 116/71 105/68  Pulse: (!) 108 (!) 106 100 93  Resp:  20 (!) 25   Temp: 98 F (36.7 C) 98.9 F (37.2 C) 100 F (37.8 C) 98.5 F (36.9 C)  TempSrc: Oral Axillary Oral Oral  SpO2:  94% 94% 93%  Weight:      Height:       Filed Weights   05/19/18 2049 05/20/18 0342  Weight: 90.7 kg 91 kg   Body mass index is 25.08 kg/m.   General appearance :Awake, alert, not in any  distress.  HEENT: Atraumatic and Normocephalic Neck: supple Resp: Decreased air entry up to right midlung-no added sounds. CVS: S1 S2 regular, no murmurs.  GI: Bowel sounds present, Non tender and not distended with no gaurding, rigidity or rebound Extremities: B/L Lower Ext shows no edema, both legs are warm to touch Neurology:  speech clear,Non focal, sensation is grossly intact. Psychiatric: Normal judgment and insight. Alert and oriented x 3. Normal mood. Musculoskeletal:No digital cyanosis Skin:No Rash, warm and dry Wounds:N/A  I have personally reviewed following labs and imaging studies  LABORATORY DATA: CBC: Recent Labs  Lab 05/20/18 0348 05/21/18 0221 05/22/18 0308 05/23/18 0411 05/24/18 0318  WBC 11.9* 10.6* 10.3 10.6* 10.4  NEUTROABS 9.4*  --   --   --   --   HGB 11.5* 10.7* 10.7* 11.0* 11.0*  HCT 35.8* 33.4* 33.7* 34.7* 34.6*  MCV 90.2 91.3 90.3 90.4 91.3  PLT 347 324 317 296 191    Basic Metabolic Panel: Recent Labs  Lab 05/20/18 0348 05/21/18 0221 05/22/18 0308 05/23/18 0411 05/24/18 0318  NA 138 138 137 137 139  K 3.9 4.1 3.6 3.7 3.9  CL 103 104 102 101 104  CO2 27 27 27 27 28   GLUCOSE 111* 123* 118* 119* 117*  BUN 9 8 7* 9 8  CREATININE 0.71 0.61 0.58* 0.58* 0.59*  CALCIUM 8.3* 7.9* 7.6* 7.7* 7.8*    GFR: Estimated Creatinine Clearance: 114.4 mL/min (A) (by C-G formula based on SCr of 0.59 mg/dL (L)).  Liver Function Tests: Recent Labs  Lab 05/19/18 2246  AST 19  ALT 13  ALKPHOS 72  BILITOT 0.6  PROT 6.4*  ALBUMIN 3.1*   Recent Labs  Lab 05/19/18 2246  LIPASE 19   Recent Labs  Lab 05/19/18 2246  AMMONIA 27    Coagulation Profile: No results for input(s): INR, PROTIME in the last 168 hours.  Cardiac Enzymes: No results for input(s): CKTOTAL, CKMB, CKMBINDEX, TROPONINI in the last 168 hours.  BNP (last 3 results) No results for input(s): PROBNP in the last 8760 hours.  HbA1C: No results for input(s): HGBA1C in the last  72 hours.  CBG: No results for input(s): GLUCAP in the last 168 hours.  Lipid Profile: No results for input(s): CHOL, HDL, LDLCALC, TRIG, CHOLHDL, LDLDIRECT in the last 72 hours.  Thyroid Function Tests: No results for input(s): TSH, T4TOTAL, FREET4, T3FREE, THYROIDAB in the last 72 hours.  Anemia Panel: No results for input(s): VITAMINB12, FOLATE, FERRITIN, TIBC, IRON, RETICCTPCT in the last 72 hours.  Urine analysis:    Component Value Date/Time   COLORURINE YELLOW 05/20/2018 [place] 05/20/2018 0317   LABSPEC 1.039 (H) 05/20/2018 0317   PHURINE 6.0 05/20/2018 [place] 05/20/2018 0317   HGBUR NEGATIVE 05/20/2018 [place] NEGATIVE 05/20/2018 [place] 05/20/2018 0317   PROTEINUR NEGATIVE 05/20/2018 0317   UROBILINOGEN 1.0 07/22/2014 0753  NITRITE NEGATIVE 05/20/2018 0317   LEUKOCYTESUR NEGATIVE 05/20/2018 0317    Sepsis Labs: Lactic Acid, Venous    Component Value Date/Time   LATICACIDVEN 0.86 05/19/2018 2301    MICROBIOLOGY: Recent Results (from the past 240 hour(s))  Culture, blood (routine x 2)     Status: None   Collection Time: 05/19/18 10:43 PM  Result Value Ref Range Status   Specimen Description BLOOD LEFT FOREARM  Final   Special Requests   Final    BOTTLES DRAWN AEROBIC AND ANAEROBIC Blood Culture adequate volume   Culture   Final    NO GROWTH 5 DAYS Performed at [facility], [address], [place], [place] [address]    Report Status 05/24/2018 FINAL  Final  Culture, blood (routine x 2)     Status: None   Collection Time: 05/19/18 10:44 PM  Result Value Ref Range Status   Specimen Description BLOOD RIGHT FOREARM  Final   Special Requests   Final    BOTTLES DRAWN AEROBIC AND ANAEROBIC Blood Culture results may not be optimal due to an inadequate volume of blood received in culture bottles   Culture   Final    NO GROWTH 5 DAYS Performed at [facility], [place] [address],  [place], [place] [address]    Report Status 05/24/2018 FINAL  Final  Acid Fast Smear (AFB)     Status: None   Collection Time: 05/21/18 12:10 PM  Result Value Ref Range Status   AFB Specimen Processing Concentration  Final   Acid Fast Smear Negative  Final    Comment: (NOTE) Performed At: [facility] [place], [place] [id number] Rush Farmer MD HU:[id number]    Source (AFB) PLEURAL  Final    Comment: FLUID RIGHT Performed at [facility], [place] [address], [place], [place] [address]   Gram stain     Status: None   Collection Time: 05/21/18 12:10 PM  Result Value Ref Range Status   Specimen Description PLEURAL FLUID RIGHT  Final   Special Requests NONE  Final   Gram Stain   Final    NO WBC SEEN NO ORGANISMS SEEN Performed at [facility], [place] [address], [place], [place] [address]    Report Status 05/21/2018 FINAL  Final  Culture, body fluid-bottle     Status: None (Preliminary result)   Collection Time: 05/21/18 12:10 PM  Result Value Ref Range Status   Specimen Description PLEURAL FLUID RIGHT  Final   Special Requests NONE  Final   Culture   Final    NO GROWTH 3 DAYS Performed at [place] [address], [place], [place] [address]    Report Status PENDING  Incomplete    RADIOLOGY STUDIES/RESULTS: Dg Chest 2 View  Result Date: 05/19/2018 CLINICAL DATA:  Dry cough and dyspnea for 2 weeks. Forty-five pack-year history of smoking. EXAM: CHEST - 2 VIEW COMPARISON:  04/03/2016 FINDINGS: New large right pleural effusion spanning 3-1/2 vertebral body heights on the lateral view is identified since prior. Some fluid drapes over the right lung apex. Vague rounded density projects over the anterior right upper lobe possibly cavitary in etiology. This measures approximately 4.9 x 4.9 x 4.5 cm. Adjacent compressive atelectasis is noted at the right lung base. Left lung remains relatively clear. The right heart border is obscured by the pleural  effusion. Mild aortic atherosclerosis at the arch without aneurysm. No acute nor suspicious osseous abnormality. Degenerative changes are  present the dorsal spine without aggressive osseous lesion identified. IMPRESSION: New large right pleural effusion spanning 3 in a half vertebral body heights with adjacent compressive atelectasis. Vague masslike opacity possibly cavitary in the right upper lobe anteriorly measuring 4.9 x 4.9 x 4.5 cm could potentially represent a necrotic neoplasm, thick-walled bulla or cavitary pneumonia among some considerations. Recommend chest CT with IV contrast. Electronically Signed   By: Ashley Royalty M.D.   On: 05/19/2018 22:39   Ct Chest W Contrast  Result Date: 05/20/2018 CLINICAL DATA:  62 year old male with shortness of breath. Pleural effusion. History of COPD and smoking. EXAM: CT CHEST WITH CONTRAST TECHNIQUE: Multidetector CT imaging of the chest was performed during intravenous contrast administration. CONTRAST:  68mL OMNIPAQUE IOHEXOL 300 MG/ML  SOLN COMPARISON:  Chest radiograph dated 05/19/2018 and CT dated 07/26/2014 FINDINGS: Cardiovascular: There is no cardiomegaly. Small pericardial effusion measuring 7 mm in thickness. There is multi vessel coronary vascular calcification. The thoracic aorta is unremarkable. Evaluation of the pulmonary arteries is very limited due to suboptimal opacification. The main pulmonary trunk and central pulmonary arteries are patent. Mediastinum/Nodes: Multiple mediastinal adenopathy as well as right hilar adenopathy including an 18 mm subcarinal and a 19 mm right suprahilar lymph node in short axis. There is an enlarged lymph node to the right of the upper esophagus in the upper mediastinum (series 3, image 51). Lungs/Pleura: There is a large right-sided pleural effusion. There is near complete collapse and consolidative changes of the right middle lobe and majority of right lower lobe. There is diffuse thickening and nodularity of the right  pleural surface consistent with pleural metastatic disease. There is a 2.0 x 4.1 cm pleural based mass in the right upper lobe anteriorly. Patchy area of ground-glass density in the left upper lobe as well as lingula and left lower lobe noted which may be infectious or inflammatory. There is no pleural effusion on the left. No pneumothorax. The central airways are patent. Upper Abdomen: There are 2 adjacent nodular densities in the right upper abdomen measuring up to 7 mm, indeterminate, possibly metastatic disease. Partially visualized 4.7 cm cystic structure in the right upper quadrant, likely a renal cyst. Musculoskeletal: No acute osseous pathology. No suspicious osseous lesions. IMPRESSION: 1. 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 There is associated compressive atelectasis of the majority of the right middle and right lower lobe. 2. Diffuse thickening and nodularity of the right pleural surface consistent with pleural passes. 3. Right upper lobe pleural based mass. 4. Right hilar and mediastinal adenopathy. 5. Patchy areas of hazy ground-glass density in the left lung, likely inflammatory/infectious. 6. Two adjacent small nodules in the right upper abdomen, indeterminate, possibly metastatic disease. Electronically Signed   By: Anner Crete M.D.   On: 05/20/2018 01:16   Dg Chest Port 1 View  Result Date: 05/21/2018 CLINICAL DATA:  Post thoracentesis on the right. EXAM: PORTABLE CHEST 1 VIEW COMPARISON:  CT and radiographs 05/19/2018. FINDINGS: 1211 hours. Subpulmonic right pleural effusion has decreased in volume. There is no significant left pleural effusion. There is no pneumothorax. Underlying right pleural nodularity and right lung interstitial thickening are stable. The heart size and mediastinal contours are stable. IMPRESSION: Decreased volume of right pleural effusion following thoracentesis. No evidence of pneumothorax. Electronically Signed   By: Richardean Sale M.D.   On: 05/21/2018 12:47   US Thoracentesis Asp Pleural Space W/img Guide  Result Date: 05/21/2018 INDICATION: Patient with history of right upper lobe infiltrate, dyspnea,  and right pleural effusion. Request is made for diagnostic and therapeutic right thoracentesis. EXAM: ULTRASOUND GUIDED DIAGNOSTIC AND THERAPEUTIC RIGHT THORACENTESIS MEDICATIONS: 10 mL of 2% lidocaine COMPLICATIONS: None immediate. PROCEDURE: An ultrasound guided thoracentesis was thoroughly discussed with the patient and questions answered. The benefits, risks, alternatives and complications were also discussed. The patient understands and wishes to proceed with the procedure. Written consent was obtained. Ultrasound was performed to localize and mark an adequate pocket of fluid in the right chest. The area was then prepped and draped in the normal sterile fashion. 2% Lidocaine was used for local anesthesia. Under ultrasound guidance a 6 Fr Safe-T-Centesis catheter was introduced. Thoracentesis was performed. The catheter was removed and a dressing applied. FINDINGS: A total of approximately 2 L of amber fluid was removed. Samples were sent to the laboratory as requested by the clinical team. IMPRESSION: Successful ultrasound guided right thoracentesis yielding 2 L of pleural fluid. Read by: Earley Abide, PA-C Electronically Signed   By: Marybelle Killings M.D.   On: 05/21/2018 12:51     LOS: 4 days   Oren Binet, MD  [facility]  If 7PM-7AM, please contact night-coverage  Please page via [email or web address]-Password [email or web address]-click on MD name and type text message  05/24/2018, 2:34 PM

## 2018-05-24 NOTE — Progress Notes (Signed)
Patients son is very upset that no doctor has spoken to them about plan of care or results. This Probation officer informed family that the provider would be paged to come talk to patient's daughter in law  is a Product manager and wants to talk to Doctor about lab results and about if some Oncology labs can be drawn. Provider paged x2 with the second page including daughter in laws number.

## 2018-05-24 NOTE — Plan of Care (Signed)

## 2018-05-25 DIAGNOSIS — R0902 Hypoxemia: Secondary | ICD-10-CM

## 2018-05-25 DIAGNOSIS — Z9889 Other specified postprocedural states: Secondary | ICD-10-CM

## 2018-05-25 LAB — CBC
HEMATOCRIT: 33.6 % — AB (ref 39.0–52.0)
Hemoglobin: 10.5 g/dL — ABNORMAL LOW (ref 13.0–17.0)
MCH: 28.2 pg (ref 26.0–34.0)
MCHC: 31.3 g/dL (ref 30.0–36.0)
MCV: 90.3 fL (ref 78.0–100.0)
Platelets: 340 10*3/uL (ref 150–400)
RBC: 3.72 MIL/uL — ABNORMAL LOW (ref 4.22–5.81)
RDW: 12.4 % (ref 11.5–15.5)
WBC: 9.7 10*3/uL (ref 4.0–10.5)

## 2018-05-25 LAB — BASIC METABOLIC PANEL
Anion gap: 9 (ref 5–15)
BUN: 8 mg/dL (ref 8–23)
CALCIUM: 8.3 mg/dL — AB (ref 8.9–10.3)
CO2: 29 mmol/L (ref 22–32)
CREATININE: 0.54 mg/dL — AB (ref 0.61–1.24)
Chloride: 100 mmol/L (ref 98–111)
GFR calc Af Amer: >60 mL/min (ref >60)
Glucose, Bld: 114 mg/dL — ABNORMAL HIGH (ref 70–99)
Potassium: 3.6 mmol/L (ref 3.5–5.1)
SODIUM: 138 mmol/L (ref 135–145)

## 2018-05-25 LAB — LACTATE DEHYDROGENASE: LDH: 214 U/L — AB (ref 98–192)

## 2018-05-25 NOTE — Plan of Care (Signed)
Patient progressing with care plan. Will continue to monitor and treat patient per MD orders.

## 2018-05-25 NOTE — Plan of Care (Signed)

## 2018-05-25 NOTE — Progress Notes (Signed)
PROGRESS NOTE        PATIENT DETAILS Name: Dale [name] Age: 62 y.o. Sex: male Date of Birth: 14-Apr-1956 Admit Date: 05/19/2018 Admitting Physician Vianne Bulls, MD GUR:[id number], Joelene Millin, NP  Brief Narrative:  Patient is a 62 years old male with past medical history of cigarette smoking, history of diastolic congestive heart failure, hepatitis C status post treatment presented to hospital with pleuritic chest pain dyspnea.  CT scan done in the ED showed a large right-sided pleural effusion with hilar and mediastinal lymphadenopathy and right upper lobe mass.  Patient underwent thoracocentesis on 05/21/2018.  Pulmonary was consulted and at this time patient is awaiting for cytology.    Subjective: Patient feels little better with his breathing.  Off oxygen.  Assessment/Plan:  Right-sided pleural-based mass with large right sided pleural effusion and hilar lymphadenopathy.  There is high possibility of primary lung cancer with a history of smoking.  Pleural fluid cytology still pending.  QuantiFERON test was indeterminate.  Patient is still on airborne precautions.  If cytology is not confirmatory patient will likely need CT-guided biopsy of the pleural lesion or endobronchial ultrasound.  Antibiotics were discontinued.  Pending ADA.  Pending cytology. Seen by pulmonary Dr. Milon Dikes.  I called the pathology lab to get information up with cytology but the report is still pending.  Depression/bipolar disorder: Continue on antipsychotic medications.  Patient is on olanzapine Trileptal and trazodone.  Chronic pain: Continue on oxycodone, MS Contin  DVT Prophylaxis: Lovenox  Code Status: Full code  Family Communication:  I spoke with the patient's son on the phone and updated him on the medical condition of the patient.   Disposition Plan: Awaiting for pleural fluid cytology report to decide further steps   Antimicrobial agents: Anti-infectives (From  admission, onward)   Start     Dose/Rate Route Frequency Ordered Stop   05/20/18 0700  vancomycin (VANCOCIN) IVPB 1000 mg/200 mL premix  Status:  Discontinued     1,000 mg 200 mL/hr over 60 Minutes Intravenous Every 8 hours 05/19/18 2230 05/21/18 1409   05/20/18 0600  aztreonam (AZACTAM) 2 g in sodium chloride 0.9 % 100 mL IVPB  Status:  Discontinued     2 g 200 mL/hr over 30 Minutes Intravenous Every 8 hours 05/19/18 2230 05/21/18 1409   05/19/18 2215  vancomycin (VANCOCIN) 2,000 mg in sodium chloride 0.9 % 500 mL IVPB     2,000 mg 250 mL/hr over 120 Minutes Intravenous  Once 05/19/18 2202 05/20/18 0139   05/19/18 2145  aztreonam (AZACTAM) 2 g in sodium chloride 0.9 % 100 mL IVPB     2 g 200 mL/hr over 30 Minutes Intravenous  Once 05/19/18 2143 05/20/18 0055   05/19/18 2145  metroNIDAZOLE (FLAGYL) IVPB 500 mg  Status:  Discontinued     500 mg 100 mL/hr over 60 Minutes Intravenous Every 8 hours 05/19/18 2143 05/21/18 1409   05/19/18 2145  vancomycin (VANCOCIN) IVPB 1000 mg/200 mL premix  Status:  Discontinued     1,000 mg 200 mL/hr over 60 Minutes Intravenous  Once 05/19/18 2143 05/19/18 2201      Procedures: 9/27>> thoracocentesis  CONSULTS:  pulmonary/intensive care  Time spent: 25- minutes-Greater than 50% of this time was spent in counseling, explanation of diagnosis, planning of further management, and coordination of care.  MEDICATIONS: Scheduled Meds: . benztropine  1 mg Oral  BID  . DULoxetine  60 mg Oral Daily  . enoxaparin (LOVENOX) injection  40 mg Subcutaneous Q24H  . morphine  30 mg Oral Q12H  . OLANZapine  45 mg Oral QHS  . Oxcarbazepine  300 mg Oral BID  . sodium chloride flush  3 mL Intravenous Q12H  . traZODone  100 mg Oral QHS   Continuous Infusions: PRN Meds:.acetaminophen **OR** acetaminophen, albuterol, bisacodyl, cyclobenzaprine, oxyCODONE, senna-docusate   PHYSICAL EXAM: Vital signs: Vitals:   05/24/18 1017 05/24/18 1950 05/25/18 0845 05/25/18  1151  BP: 106/79 116/72 111/75 106/77  Pulse:  (!) 110  90  Resp:  20 18   Temp:  99.1 F (37.3 C) 98.4 F (36.9 C) 98.6 F (37 C)  TempSrc:  Oral Oral Oral  SpO2:  94% 91%   Weight:      Height:       Filed Weights   05/19/18 2049 05/20/18 0342  Weight: 90.7 kg 91 kg   Body mass index is 25.08 kg/m.   Examination: General exam: Appears calm and comfortable ,Not in distress,thinly built. HEENT:PERRL,Oral mucosa moist, Ear/Nose normal on gross exam Respiratory system: Bilateral equal air entry, normal vesicular breath sounds, no wheezes or crackles  Cardiovascular system: S1 & S2 heard, RRR. No JVD, murmurs, rubs, gallops or clicks. No pedal edema. Gastrointestinal system: Abdomen is nondistended, soft and nontender. No organomegaly or masses felt. Normal bowel sounds heard. Central nervous system: Alert and oriented. No focal neurological deficits. Extremities: No edema, no clubbing ,no cyanosis, distal peripheral pulses palpable. Skin: No rashes, lesions or ulcers,no icterus ,no pallor MSK: Normal muscle bulk,tone ,power Psychiatry: Judgement and insight appear normal. Mood & affect appropriate.   I have personally reviewed following labs and imaging studies  LABORATORY DATA: CBC: Recent Labs  Lab 05/20/18 0348 05/21/18 0221 05/22/18 0308 05/23/18 0411 05/24/18 0318 05/25/18 0523  WBC 11.9* 10.6* 10.3 10.6* 10.4 9.7  NEUTROABS 9.4*  --   --   --   --   --   HGB 11.5* 10.7* 10.7* 11.0* 11.0* 10.5*  HCT 35.8* 33.4* 33.7* 34.7* 34.6* 33.6*  MCV 90.2 91.3 90.3 90.4 91.3 90.3  PLT 347 324 317 296 325 597    Basic Metabolic Panel: Recent Labs  Lab 05/21/18 0221 05/22/18 0308 05/23/18 0411 05/24/18 0318 05/25/18 0523  NA 138 137 137 139 138  K 4.1 3.6 3.7 3.9 3.6  CL 104 102 101 104 100  CO2 27 27 27 28 29   GLUCOSE 123* 118* 119* 117* 114*  BUN 8 7* 9 8 8   CREATININE 0.61 0.58* 0.58* 0.59* 0.54*  CALCIUM 7.9* 7.6* 7.7* 7.8* 8.3*    GFR: Estimated  Creatinine Clearance: 114.4 mL/min (A) (by C-G formula based on SCr of 0.54 mg/dL (L)).  Liver Function Tests: Recent Labs  Lab 05/19/18 2246  AST 19  ALT 13  ALKPHOS 72  BILITOT 0.6  PROT 6.4*  ALBUMIN 3.1*   Recent Labs  Lab 05/19/18 2246  LIPASE 19   Recent Labs  Lab 05/19/18 2246  AMMONIA 27    Coagulation Profile: No results for input(s): INR, PROTIME in the last 168 hours.  Cardiac Enzymes: No results for input(s): CKTOTAL, CKMB, CKMBINDEX, TROPONINI in the last 168 hours.  BNP (last 3 results) No results for input(s): PROBNP in the last 8760 hours.  HbA1C: No results for input(s): HGBA1C in the last 72 hours.  CBG: No results for input(s): GLUCAP in the last 168 hours.  Lipid Profile: No  results for input(s): CHOL, HDL, LDLCALC, TRIG, CHOLHDL, LDLDIRECT in the last 72 hours.  Thyroid Function Tests: No results for input(s): TSH, T4TOTAL, FREET4, T3FREE, THYROIDAB in the last 72 hours.  Anemia Panel: No results for input(s): VITAMINB12, FOLATE, FERRITIN, TIBC, IRON, RETICCTPCT in the last 72 hours.  Urine analysis:    Component Value Date/Time   COLORURINE YELLOW 05/20/2018 [place] 05/20/2018 0317   LABSPEC 1.039 (H) 05/20/2018 0317   PHURINE 6.0 05/20/2018 0317   GLUCOSEU NEGATIVE 05/20/2018 0317   HGBUR NEGATIVE 05/20/2018 0317   BILIRUBINUR NEGATIVE 05/20/2018 0317   KETONESUR NEGATIVE 05/20/2018 0317   PROTEINUR NEGATIVE 05/20/2018 0317   UROBILINOGEN 1.0 07/22/2014 0753   NITRITE NEGATIVE 05/20/2018 0317   LEUKOCYTESUR NEGATIVE 05/20/2018 0317    Sepsis Labs: Lactic Acid, Venous    Component Value Date/Time   LATICACIDVEN 0.86 05/19/2018 2301    MICROBIOLOGY: Recent Results (from the past 240 hour(s))  Culture, blood (routine x 2)     Status: None   Collection Time: 05/19/18 10:43 PM  Result Value Ref Range Status   Specimen Description BLOOD LEFT FOREARM  Final   Special Requests   Final    BOTTLES DRAWN  AEROBIC AND ANAEROBIC Blood Culture adequate volume   Culture   Final    NO GROWTH 5 DAYS Performed at [facility], [place] [address] [place], [place] [address]    Report Status 05/24/2018 FINAL  Final  Culture, blood (routine x 2)     Status: None   Collection Time: 05/19/18 10:44 PM  Result Value Ref Range Status   Specimen Description BLOOD RIGHT FOREARM  Final   Special Requests   Final    BOTTLES DRAWN AEROBIC AND ANAEROBIC Blood Culture results may not be optimal due to an inadequate volume of blood received in culture bottles   Culture   Final    NO GROWTH 5 DAYS Performed at [facility], [place] [address], [place], [place] [address]    Report Status 05/24/2018 FINAL  Final  Acid Fast Smear (AFB)     Status: None   Collection Time: 05/21/18 12:10 PM  Result Value Ref Range Status   AFB Specimen Processing Concentration  Final   Acid Fast Smear Negative  Final    Comment: (NOTE) Performed At: [facility] [place], [place] [id number] Rush Farmer MD JA:[id number]    Source (AFB) PLEURAL  Final    Comment: FLUID RIGHT Performed at [facility], [place] [address] [place], [place] [address]   Gram stain     Status: None   Collection Time: 05/21/18 12:10 PM  Result Value Ref Range Status   Specimen Description PLEURAL FLUID RIGHT  Final   Special Requests NONE  Final   Gram Stain   Final    NO WBC SEEN NO ORGANISMS SEEN Performed at [facility], [place] [address], [place], [place] [address]    Report Status 05/21/2018 FINAL  Final  Culture, body fluid-bottle     Status: None (Preliminary result)   Collection Time: 05/21/18 12:10 PM  Result Value Ref Range Status   Specimen Description PLEURAL FLUID RIGHT  Final   Special Requests NONE  Final   Culture   Final    NO GROWTH 4 DAYS Performed at [place] [address] [place], [place] [address]    Report Status PENDING  Incomplete    RADIOLOGY  STUDIES/RESULTS: Dg  Chest 2 View  Result Date: 05/19/2018 CLINICAL DATA:  Dry cough and dyspnea for 2 weeks. Forty-five pack-year history of smoking. EXAM: CHEST - 2 VIEW COMPARISON:  04/03/2016 FINDINGS: New large right pleural effusion spanning 3-1/2 vertebral body heights on the lateral view is identified since prior. Some fluid drapes over the right lung apex. Vague rounded density projects over the anterior right upper lobe possibly cavitary in etiology. This measures approximately 4.9 x 4.9 x 4.5 cm. Adjacent compressive atelectasis is noted at the right lung base. Left lung remains relatively clear. The right heart border is obscured by the pleural effusion. Mild aortic atherosclerosis at the arch without aneurysm. No acute nor suspicious osseous abnormality. Degenerative changes are present the dorsal spine without aggressive osseous lesion identified. IMPRESSION: New large right pleural effusion spanning 3 in a half vertebral body heights with adjacent compressive atelectasis. Vague masslike opacity possibly cavitary in the right upper lobe anteriorly measuring 4.9 x 4.9 x 4.5 cm could potentially represent a necrotic neoplasm, thick-walled bulla or cavitary pneumonia among some considerations. Recommend chest CT with IV contrast. Electronically Signed   By: Ashley Royalty M.D.   On: 05/19/2018 22:39   Ct Chest W Contrast  Result Date: 05/20/2018 CLINICAL DATA:  62 year old male with shortness of breath. Pleural effusion. History of COPD and smoking. EXAM: CT CHEST WITH CONTRAST TECHNIQUE: Multidetector CT imaging of the chest was performed during intravenous contrast administration. CONTRAST:  56mL OMNIPAQUE IOHEXOL 300 MG/ML  SOLN COMPARISON:  Chest radiograph dated 05/19/2018 and CT dated 07/26/2014 FINDINGS: Cardiovascular: There is no cardiomegaly. Small pericardial effusion measuring 7 mm in thickness. There is multi vessel coronary vascular calcification. The thoracic aorta is unremarkable.  Evaluation of the pulmonary arteries is very limited due to suboptimal opacification. The main pulmonary trunk and central pulmonary arteries are patent. Mediastinum/Nodes: Multiple mediastinal adenopathy as well as right hilar adenopathy including an 18 mm subcarinal and a 19 mm right suprahilar lymph node in short axis. There is an enlarged lymph node to the right of the upper esophagus in the upper mediastinum (series 3, image 51). Lungs/Pleura: There is a large right-sided pleural effusion. There is near complete collapse and consolidative changes of the right middle lobe and majority of right lower lobe. There is diffuse thickening and nodularity of the right pleural surface consistent with pleural metastatic disease. There is a 2.0 x 4.1 cm pleural based mass in the right upper lobe anteriorly. Patchy area of ground-glass density in the left upper lobe as well as lingula and left lower lobe noted which may be infectious or inflammatory. There is no pleural effusion on the left. No pneumothorax. The central airways are patent. Upper Abdomen: There are 2 adjacent nodular densities in the right upper abdomen measuring up to 7 mm, indeterminate, possibly metastatic disease. Partially visualized 4.7 cm cystic structure in the right upper quadrant, likely a renal cyst. Musculoskeletal: No acute osseous pathology. No suspicious osseous lesions. IMPRESSION: 1. 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 There is associated compressive atelectasis of the majority of the right middle and right lower lobe. 2. Diffuse thickening and nodularity of the right pleural surface consistent with pleural passes. 3. Right upper lobe pleural based mass. 4. Right hilar and mediastinal adenopathy. 5. Patchy areas of hazy ground-glass density in the left lung, likely inflammatory/infectious. 6. Two adjacent small nodules in the right upper abdomen, indeterminate, possibly metastatic disease. Electronically  Signed   By: Anner Crete M.D.   On: 05/20/2018  01:16   Dg Chest Port 1 View  Result Date: 05/21/2018 CLINICAL DATA:  Post thoracentesis on the right. EXAM: PORTABLE CHEST 1 VIEW COMPARISON:  CT and radiographs 05/19/2018. FINDINGS: 1211 hours. Subpulmonic right pleural effusion has decreased in volume. There is no significant left pleural effusion. There is no pneumothorax. Underlying right pleural nodularity and right lung interstitial thickening are stable. The heart size and mediastinal contours are stable. IMPRESSION: Decreased volume of right pleural effusion following thoracentesis. No evidence of pneumothorax. Electronically Signed   By: Richardean Sale M.D.   On: 05/21/2018 12:47   US Thoracentesis Asp Pleural Space W/img Guide  Result Date: 05/21/2018 INDICATION: Patient with history of right upper lobe infiltrate, dyspnea, and right pleural effusion. Request is made for diagnostic and therapeutic right thoracentesis. EXAM: ULTRASOUND GUIDED DIAGNOSTIC AND THERAPEUTIC RIGHT THORACENTESIS MEDICATIONS: 10 mL of 2% lidocaine COMPLICATIONS: None immediate. PROCEDURE: An ultrasound guided thoracentesis was thoroughly discussed with the patient and questions answered. The benefits, risks, alternatives and complications were also discussed. The patient understands and wishes to proceed with the procedure. Written consent was obtained. Ultrasound was performed to localize and mark an adequate pocket of fluid in the right chest. The area was then prepped and draped in the normal sterile fashion. 2% Lidocaine was used for local anesthesia. Under ultrasound guidance a 6 Fr Safe-T-Centesis catheter was introduced. Thoracentesis was performed. The catheter was removed and a dressing applied. FINDINGS: A total of approximately 2 L of amber fluid was removed. Samples were sent to the laboratory as requested by the clinical team. IMPRESSION: Successful ultrasound guided right thoracentesis yielding 2 L of  pleural fluid. Read by: Earley Abide, PA-C Electronically Signed   By: Marybelle Killings M.D.   On: 05/21/2018 12:51     LOS: 5 days   Flora Lipps, MD  [facility]  If 7PM-7AM, please contact night-coverage  Please page via [email or web address]-click on MD name and type text message  05/25/2018, 12:02 PM

## 2018-05-26 LAB — CULTURE, BODY FLUID-BOTTLE

## 2018-05-26 LAB — ADENOSIDE DEAMINASE, PLEURAL FL: ADENOSIDE DEAMINASE, PLEURAL FL: 14.5 U/L — ABNORMAL HIGH (ref 0.0–9.4)

## 2018-05-26 LAB — CULTURE, BODY FLUID W GRAM STAIN -BOTTLE: Culture: NO GROWTH

## 2018-05-26 LAB — CEA: CEA1: 4.9 ng/mL — AB (ref 0.0–4.7)

## 2018-05-26 NOTE — Progress Notes (Signed)
Anderson Malta with Infectious Prevention called to notify RN patient no longer needs to be on airborne precautions at this time.  MD stated this was malignancy vs TB since fluid collected from pleural space was negative. Isolation removed at this time.

## 2018-05-26 NOTE — Progress Notes (Signed)
PROGRESS NOTE        PATIENT DETAILS Name: Dale [name] Age: 62 y.o. Sex: male Date of Birth: Jul 12, 1956 Admit Date: 05/19/2018 Admitting Physician Dale Bulls, MD CBU:[id number], Dale Millin, NP  Brief Narrative:  Patient is a 62 years old male with past medical history of cigarette smoking, history of diastolic congestive heart failure, hepatitis C status post treatment presented to hospital with pleuritic chest pain dyspnea.  CT scan done in the ED showed a large right-sided pleural effusion with hilar and mediastinal lymphadenopathy and right upper lobe mass.  Patient underwent thoracocentesis on 05/21/2018.  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  I discussed the findings with the oncologist on-call, Dr. Burr [name].  Dr. Burr [name] advised outpatient oncology follow-up.  Updated patient and patient's son, Ms. Dale [name].  Patient's son informs me that his wife recent oncology nurse at [facility], [place].  Patient's son informed me that the patient will follow-up with an oncologist over at [facility], [place].  Subjective: No new complaints.    Assessment/Plan:  Right-sided pleural-based mass with large right sided pleural effusion and hilar lymphadenopathy.  There is high possibility of primary lung cancer with a history of smoking.  Pleural fluid cytology still pending.  QuantiFERON test was indeterminate.  Patient is still on airborne precautions.  If cytology is not confirmatory patient will likely need CT-guided biopsy of the pleural lesion or endobronchial ultrasound.  Antibiotics were discontinued.  Pending ADA.  Pending cytology. Seen by pulmonary Dr. Milon [name].  I called the pathology lab to get information up with cytology but the report is still pending.  05/26/2018: Kindly see above.  Cytology is atypical for adenocarcinoma.  Likely discharge in the morning.  Patient's son wants patient to follow-up with  oncology team at [facility], [place].  Depression/bipolar disorder: Continue on antipsychotic medications.  Patient is on olanzapine Trileptal and trazodone.  Chronic pain: Continue on oxycodone, MS Contin  DVT Prophylaxis: Lovenox  Code Status: Full code  Family Communication:  I discussed with the patient's son, Mr. Dale [name], extensively.  Disposition Plan: Likely discharge tomorrow.  Antimicrobial agents: Anti-infectives (From admission, onward)   Start     Dose/Rate Route Frequency Ordered Stop   05/20/18 0700  vancomycin (VANCOCIN) IVPB 1000 mg/200 mL premix  Status:  Discontinued     1,000 mg 200 mL/hr over 60 Minutes Intravenous Every 8 hours 05/19/18 2230 05/21/18 1409   05/20/18 0600  aztreonam (AZACTAM) 2 g in sodium chloride 0.9 % 100 mL IVPB  Status:  Discontinued     2 g 200 mL/hr over 30 Minutes Intravenous Every 8 hours 05/19/18 2230 05/21/18 1409   05/19/18 2215  vancomycin (VANCOCIN) 2,000 mg in sodium chloride 0.9 % 500 mL IVPB     2,000 mg 250 mL/hr over 120 Minutes Intravenous  Once 05/19/18 2202 05/20/18 0139   05/19/18 2145  aztreonam (AZACTAM) 2 g in sodium chloride 0.9 % 100 mL IVPB     2 g 200 mL/hr over 30 Minutes Intravenous  Once 05/19/18 2143 05/20/18 0055   05/19/18 2145  metroNIDAZOLE (FLAGYL) IVPB 500 mg  Status:  Discontinued     500 mg 100 mL/hr over 60 Minutes Intravenous Every 8 hours 05/19/18 2143 05/21/18 1409   05/19/18 2145  vancomycin (VANCOCIN) IVPB 1000 mg/200 mL premix  Status:  Discontinued  1,000 mg 200 mL/hr over 60 Minutes Intravenous  Once 05/19/18 2143 05/19/18 2201      Procedures: 9/27>> thoracocentesis  CONSULTS:  pulmonary/intensive care Discussed patient's care with the oncologist, Dr. Pilar [name].  Time spent: 25- minutes  MEDICATIONS: Scheduled Meds: . benztropine  1 mg Oral BID  . DULoxetine  60 mg Oral Daily  . enoxaparin (LOVENOX) injection  40 mg Subcutaneous Q24H  . morphine  30 mg Oral Q12H    . OLANZapine  45 mg Oral QHS  . Oxcarbazepine  300 mg Oral BID  . sodium chloride flush  3 mL Intravenous Q12H  . traZODone  100 mg Oral QHS   Continuous Infusions: PRN Meds:.acetaminophen **OR** acetaminophen, albuterol, bisacodyl, cyclobenzaprine, oxyCODONE, senna-docusate   PHYSICAL EXAM: Vital signs: Vitals:   05/26/18 0000 05/26/18 0400 05/26/18 0854 05/26/18 1127  BP: 111/76 113/76 124/84 128/82  Pulse:   92 99  Resp:      Temp: 98.6 F (37 C) 98.2 F (36.8 C) 97.8 F (36.6 C) 98.3 F (36.8 C)  TempSrc: Oral Oral Oral Oral  SpO2:   94% 94%  Weight:      Height:       Filed Weights   05/19/18 2049 05/20/18 0342  Weight: 90.7 kg 91 kg   Body mass index is 25.08 kg/m.   Examination: General exam: Appears calm and comfortable. Respiratory system: Decreased air entry right lung base posteriorly.  Cardiovascular system: S1 & S2. No pedal edema. Gastrointestinal system: Abdomen is nondistended, soft and nontender. No organomegaly or masses felt. Normal bowel sounds heard. Central nervous system: Alert and oriented. No focal neurological deficits. Extremities: No edema, no clubbing ,no cyanosis, distal peripheral pulses palpable. MSK: Normal muscle bulk,tone ,power   LABORATORY DATA: CBC: Recent Labs  Lab 05/20/18 0348 05/21/18 0221 05/22/18 0308 05/23/18 0411 05/24/18 0318 05/25/18 0523  WBC 11.9* 10.6* 10.3 10.6* 10.4 9.7  NEUTROABS 9.4*  --   --   --   --   --   HGB 11.5* 10.7* 10.7* 11.0* 11.0* 10.5*  HCT 35.8* 33.4* 33.7* 34.7* 34.6* 33.6*  MCV 90.2 91.3 90.3 90.4 91.3 90.3  PLT 347 324 317 296 325 962    Basic Metabolic Panel: Recent Labs  Lab 05/21/18 0221 05/22/18 0308 05/23/18 0411 05/24/18 0318 05/25/18 0523  NA 138 137 137 139 138  K 4.1 3.6 3.7 3.9 3.6  CL 104 102 101 104 100  CO2 27 27 27 28 29   GLUCOSE 123* 118* 119* 117* 114*  BUN 8 7* 9 8 8   CREATININE 0.61 0.58* 0.58* 0.59* 0.54*  CALCIUM 7.9* 7.6* 7.7* 7.8* 8.3*     GFR: Estimated Creatinine Clearance: 114.4 mL/min (A) (by C-G formula based on SCr of 0.54 mg/dL (L)).  Liver Function Tests: Recent Labs  Lab 05/19/18 2246  AST 19  ALT 13  ALKPHOS 72  BILITOT 0.6  PROT 6.4*  ALBUMIN 3.1*   Recent Labs  Lab 05/19/18 2246  LIPASE 19   Recent Labs  Lab 05/19/18 2246  AMMONIA 27    Coagulation Profile: No results for input(s): INR, PROTIME in the last 168 hours.  Cardiac Enzymes: No results for input(s): CKTOTAL, CKMB, CKMBINDEX, TROPONINI in the last 168 hours.  BNP (last 3 results) No results for input(s): PROBNP in the last 8760 hours.  HbA1C: No results for input(s): HGBA1C in the last 72 hours.  CBG: No results for input(s): GLUCAP in the last 168 hours.  Lipid Profile: No results for  input(s): CHOL, HDL, LDLCALC, TRIG, CHOLHDL, LDLDIRECT in the last 72 hours.  Thyroid Function Tests: No results for input(s): TSH, T4TOTAL, FREET4, T3FREE, THYROIDAB in the last 72 hours.  Anemia Panel: No results for input(s): VITAMINB12, FOLATE, FERRITIN, TIBC, IRON, RETICCTPCT in the last 72 hours.  Urine analysis:    Component Value Date/Time   COLORURINE YELLOW 05/20/2018 [place] 05/20/2018 0317   LABSPEC 1.039 (H) 05/20/2018 0317   PHURINE 6.0 05/20/2018 0317   GLUCOSEU NEGATIVE 05/20/2018 0317   HGBUR NEGATIVE 05/20/2018 0317   BILIRUBINUR NEGATIVE 05/20/2018 0317   KETONESUR NEGATIVE 05/20/2018 0317   PROTEINUR NEGATIVE 05/20/2018 0317   UROBILINOGEN 1.0 07/22/2014 0753   NITRITE NEGATIVE 05/20/2018 0317   LEUKOCYTESUR NEGATIVE 05/20/2018 0317    Sepsis Labs: Lactic Acid, Venous    Component Value Date/Time   LATICACIDVEN 0.86 05/19/2018 2301    MICROBIOLOGY: Recent Results (from the past 240 hour(s))  Culture, blood (routine x 2)     Status: None   Collection Time: 05/19/18 10:43 PM  Result Value Ref Range Status   Specimen Description BLOOD LEFT FOREARM  Final   Special Requests   Final     BOTTLES DRAWN AEROBIC AND ANAEROBIC Blood Culture adequate volume   Culture   Final    NO GROWTH 5 DAYS Performed at [facility], [place] [address] [place], [place] [address]    Report Status 05/24/2018 FINAL  Final  Culture, blood (routine x 2)     Status: None   Collection Time: 05/19/18 10:44 PM  Result Value Ref Range Status   Specimen Description BLOOD RIGHT FOREARM  Final   Special Requests   Final    BOTTLES DRAWN AEROBIC AND ANAEROBIC Blood Culture results may not be optimal due to an inadequate volume of blood received in culture bottles   Culture   Final    NO GROWTH 5 DAYS Performed at [facility], [place] [address] [place], [place] [address]    Report Status 05/24/2018 FINAL  Final  Acid Fast Smear (AFB)     Status: None   Collection Time: 05/21/18 12:10 PM  Result Value Ref Range Status   AFB Specimen Processing Concentration  Final   Acid Fast Smear Negative  Final    Comment: (NOTE) Performed At: [facility] [place], [place] [id number] Rush Farmer MD QJ:[id number]    Source (AFB) PLEURAL  Final    Comment: FLUID RIGHT Performed at [facility], [place] [address] [place], [place] [address]   Gram stain     Status: None   Collection Time: 05/21/18 12:10 PM  Result Value Ref Range Status   Specimen Description PLEURAL FLUID RIGHT  Final   Special Requests NONE  Final   Gram Stain   Final    NO WBC SEEN NO ORGANISMS SEEN Performed at [facility], [place] [address] [place], [place] [address]    Report Status 05/21/2018 FINAL  Final  Culture, body fluid-bottle     Status: None   Collection Time: 05/21/18 12:10 PM  Result Value Ref Range Status   Specimen Description PLEURAL FLUID RIGHT  Final   Special Requests NONE  Final   Culture   Final    NO GROWTH 5 DAYS Performed at [place] [address] [place], [place] [address]    Report Status 05/26/2018 FINAL  Final    RADIOLOGY  STUDIES/RESULTS: Dg Chest 2 View  Result Date: 05/19/2018 CLINICAL DATA:  Dry cough and dyspnea for 2 weeks. Forty-five pack-year history of smoking. EXAM: CHEST - 2 VIEW COMPARISON:  04/03/2016 FINDINGS: New large right pleural effusion spanning 3-1/2 vertebral body heights on the lateral view is identified since prior. Some fluid drapes over the right lung apex. Vague rounded density projects over the anterior right upper lobe possibly cavitary in etiology. This measures approximately 4.9 x 4.9 x 4.5 cm. Adjacent compressive atelectasis is noted at the right lung base. Left lung remains relatively clear. The right heart border is obscured by the pleural effusion. Mild aortic atherosclerosis at the arch without aneurysm. No acute nor suspicious osseous abnormality. Degenerative changes are present the dorsal spine without aggressive osseous lesion identified. IMPRESSION: New large right pleural effusion spanning 3 in a half vertebral body heights with adjacent compressive atelectasis. Vague masslike opacity possibly cavitary in the right upper lobe anteriorly measuring 4.9 x 4.9 x 4.5 cm could potentially represent a necrotic neoplasm, thick-walled bulla or cavitary pneumonia among some considerations. Recommend chest CT with IV contrast. Electronically Signed   By: Ashley Royalty M.D.   On: 05/19/2018 22:39   Ct Chest W Contrast  Result Date: 05/20/2018 CLINICAL DATA:  62 year old male with shortness of breath. Pleural effusion. History of COPD and smoking. EXAM: CT CHEST WITH CONTRAST TECHNIQUE: Multidetector CT imaging of the chest was performed during intravenous contrast administration. CONTRAST:  25mL OMNIPAQUE IOHEXOL 300 MG/ML  SOLN COMPARISON:  Chest radiograph dated 05/19/2018 and CT dated 07/26/2014 FINDINGS: Cardiovascular: There is no cardiomegaly. Small pericardial effusion measuring 7 mm in thickness. There is multi vessel coronary vascular calcification. The thoracic aorta is unremarkable.  Evaluation of the pulmonary arteries is very limited due to suboptimal opacification. The main pulmonary trunk and central pulmonary arteries are patent. Mediastinum/Nodes: Multiple mediastinal adenopathy as well as right hilar adenopathy including an 18 mm subcarinal and a 19 mm right suprahilar lymph node in short axis. There is an enlarged lymph node to the right of the upper esophagus in the upper mediastinum (series 3, image 51). Lungs/Pleura: There is a large right-sided pleural effusion. There is near complete collapse and consolidative changes of the right middle lobe and majority of right lower lobe. There is diffuse thickening and nodularity of the right pleural surface consistent with pleural metastatic disease. There is a 2.0 x 4.1 cm pleural based mass in the right upper lobe anteriorly. Patchy area of ground-glass density in the left upper lobe as well as lingula and left lower lobe noted which may be infectious or inflammatory. There is no pleural effusion on the left. No pneumothorax. The central airways are patent. Upper Abdomen: There are 2 adjacent nodular densities in the right upper abdomen measuring up to 7 mm, indeterminate, possibly metastatic disease. Partially visualized 4.7 cm cystic structure in the right upper quadrant, likely a renal cyst. Musculoskeletal: No acute osseous pathology. No suspicious osseous lesions. IMPRESSION: 1. 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 There is associated compressive atelectasis of the majority of the right middle and right lower lobe. 2. Diffuse thickening and nodularity of the right pleural surface consistent with pleural passes. 3. Right upper lobe pleural based mass. 4. Right hilar and mediastinal adenopathy. 5. Patchy areas of hazy ground-glass density in the left lung, likely inflammatory/infectious. 6. Two adjacent small nodules in the right upper abdomen, indeterminate, possibly metastatic disease. Electronically  Signed   By: Anner Crete M.D.   On: 05/20/2018 01:16   Dg  Chest Port 1 View  Result Date: 05/21/2018 CLINICAL DATA:  Post thoracentesis on the right. EXAM: PORTABLE CHEST 1 VIEW COMPARISON:  CT and radiographs 05/19/2018. FINDINGS: 1211 hours. Subpulmonic right pleural effusion has decreased in volume. There is no significant left pleural effusion. There is no pneumothorax. Underlying right pleural nodularity and right lung interstitial thickening are stable. The heart size and mediastinal contours are stable. IMPRESSION: Decreased volume of right pleural effusion following thoracentesis. No evidence of pneumothorax. Electronically Signed   By: Richardean Sale M.D.   On: 05/21/2018 12:47   US Thoracentesis Asp Pleural Space W/img Guide  Result Date: 05/21/2018 INDICATION: Patient with history of right upper lobe infiltrate, dyspnea, and right pleural effusion. Request is made for diagnostic and therapeutic right thoracentesis. EXAM: ULTRASOUND GUIDED DIAGNOSTIC AND THERAPEUTIC RIGHT THORACENTESIS MEDICATIONS: 10 mL of 2% lidocaine COMPLICATIONS: None immediate. PROCEDURE: An ultrasound guided thoracentesis was thoroughly discussed with the patient and questions answered. The benefits, risks, alternatives and complications were also discussed. The patient understands and wishes to proceed with the procedure. Written consent was obtained. Ultrasound was performed to localize and mark an adequate pocket of fluid in the right chest. The area was then prepped and draped in the normal sterile fashion. 2% Lidocaine was used for local anesthesia. Under ultrasound guidance a 6 Fr Safe-T-Centesis catheter was introduced. Thoracentesis was performed. The catheter was removed and a dressing applied. FINDINGS: A total of approximately 2 L of amber fluid was removed. Samples were sent to the laboratory as requested by the clinical team. IMPRESSION: Successful ultrasound guided right thoracentesis yielding 2 L of  pleural fluid. Read by: Earley Abide, PA-C Electronically Signed   By: Marybelle Killings M.D.   On: 05/21/2018 12:51     LOS: 6 days   Bonnell Public, MD  [facility]  If 7PM-7AM, please contact night-coverage  Please page via [email or web address]-click on MD name and type text message  05/26/2018, 3:37 PM

## 2018-05-26 NOTE — Progress Notes (Signed)
Chaplain received consult from huddle regarding pt.  Recommendation that the pt be presented with info on [facility].  Chaplain visited pt and explained the benefits of having an AD. Patient said that this was not the time but he would review it, and thanked me.  Will follow-up as necessary. Tamsen Snider Pager [phone]

## 2018-05-27 MED ORDER — OXYCODONE HCL 5 MG PO TABS: 5.0000 mg | ORAL_TABLET | Freq: Four times a day (QID) | ORAL | 0 refills | Status: AC | PRN

## 2018-05-27 MED ORDER — MORPHINE SULFATE ER 30 MG PO TBCR: 30.0000 mg | EXTENDED_RELEASE_TABLET | Freq: Two times a day (BID) | ORAL | 0 refills | Status: AC

## 2018-05-27 MED ORDER — SENNOSIDES-DOCUSATE SODIUM 8.6-50 MG PO TABS: 1.0000 | ORAL_TABLET | Freq: Every evening | ORAL | 0 refills | Status: AC | PRN

## 2018-05-27 MED ORDER — OLANZAPINE 15 MG PO TABS: 15.0000 mg | ORAL_TABLET | Freq: Every day | ORAL | 0 refills | Status: AC

## 2018-05-27 NOTE — Progress Notes (Signed)
Patients son [name] called to find out why the patient is being discharged and not transferred to [facility]. I explained that the patients condition does not require hospitalization at this point per the MD. The MD has noted that the patient needs to follow up with oncology outpatient. The son was verbally abusive to myself and stated that he would be picking up his dad and taking him straight to [facility], He stated that this hospital was "unbelievable". I tried to explain to him what care his father needed however he was not interested in hearing what we have to say. He hung up on me. The patients son's wife is a Marine scientist on 9w oncology at [facility] [employer] [facility] [employer] and has been speaking to doctors there and there is a misunderstanding as to what care he needs verse what they want him to have.

## 2018-05-27 NOTE — Care Management (Signed)
Received message that patient's daughter in law wants patient evaluated for home oxygen. Entered order for home ambulatory oxygen saturation note.   Magdalen Spatz RN BSN [phone]

## 2018-05-27 NOTE — Progress Notes (Signed)
SATURATION QUALIFICATIONS: (This note is used to comply with regulatory documentation for home oxygen)  Patient Saturations on Room Air at Rest = 92%  Patient Saturations on Room Air while Ambulating = 86%  Patient Saturations on 2 Liters of oxygen while Ambulating = 93%  Please briefly explain why patient needs home oxygen: Pt complains of shortness of breath and dizziness on room air. States he feels relief of symptoms with oxygen. Family also has advocated for patient to d/c with 02.

## 2018-05-27 NOTE — Care Management Important Message (Signed)
Important Message  Patient Details  Name: Dale [name] MRN: [id number] Date of Birth: Jul 15, 1956   Medicare Important Message Given:  Yes    [name] 05/27/2018, 3:12 PM

## 2018-05-27 NOTE — Care Management Important Message (Signed)
Important Message  Patient Details  Name: Dale [name] MRN: [id number] Date of Birth: 03/06/1956   Medicare Important Message Given:  Yes    Emanual Lamountain 05/27/2018, 9:20 AM

## 2018-05-27 NOTE — Progress Notes (Signed)
Pt discharged home. Pt discharge paper work complete. Pt verbalizes understanding of AVS and prescriptions. Vitals taken and are stable. Pt sent home with home 02 supplies. Daughter-n-law picked up pt. Pt escorted out via staff.

## 2018-05-27 NOTE — Plan of Care (Signed)

## 2018-05-27 NOTE — Care Management Note (Signed)
Case Management Note  Patient Details  Name: Zayed Griffie MRN: [id number] Date of Birth: 10-27-1955  Subjective/Objective:                    Action/Plan: Ordered home oxygen through [facility], Dan with [facility] aware patient is discharged and will bring portable tank to room.  Expected Discharge Date:  05/27/18               Expected Discharge Plan:  Home/Self Care  In-House Referral:     Discharge planning Services  CM Consult  Post Acute Care Choice:  Durable Medical Equipment Choice offered to:     DME Arranged:  Oxygen DME Agency:  [place]:  NA HH Agency:  NA  Status of Service:  Completed, signed off  If discussed at [place] of Stay Meetings, dates discussed:    Additional Comments:  Marilu Favre, RN 05/27/2018, 3:26 PM

## 2018-05-27 NOTE — Progress Notes (Signed)
Pt's son called and requested to speak with MD about pt care and test to be done before d/c. Dr. Marthenia Rolling called and notified about family concerns.

## 2018-05-27 NOTE — Discharge Summary (Signed)
Physician Discharge Summary  Patient ID: Dale [name] MRN: [id number] DOB/AGE: 62/28/1957 62 y.o.  Admit date: 05/19/2018 Discharge date: 05/27/2018  Admission Diagnoses:  Discharge Diagnoses:  Principal Problem:   Adenocarcinoma of the lung.   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   Prolonged QT interval   Chronic diastolic CHF (congestive heart failure) ([place])   Discharged Condition: stable  Hospital Course:  Patient is a 62 years old male with past medical history significant for cigarette smoking, history of diastolic congestive heart failure, hepatitis C status post treatment presented to hospital with pleuritic chest pain dyspnea.  CT scan done in the ED showed a large right-sided pleural effusion with hilar and mediastinal lymphadenopathy and right upper lobe mass.  Patient underwent thoracocentesis on 05/21/2018.  Discussed with the pathology lab, and the cytology is said to reveal adenocarcinoma.  I discussed the findings with the oncologist on-call, Dr. Burr Medico.  Dr. Burr Medico advised outpatient oncology follow-up.  Updated patient and patient's son, Dale [name].  Patient's son informed me that his wife was an oncology nurse at [facility], [name].  Patient's son informed me that the patient would follow-up with an oncologist over at [facility], [place].  This patient will be discharged to the care of the primary care provider and oncology team based out of [facility], [place].  Infectious disease team was consulted initially as there were concerns for possible tuberculosis.  The pleural fluid analysis was suggestive of adenocarcinoma.  Consults: ID and hematology/oncology  Significant  Diagnostic Studies:  CT scan of the chest with contrast revealed "Large right pleural effusion most consistent with malignant effusion. There is associated compressive atelectasis of the majority of the right middle and right lower lobe. 2. Diffuse thickening and nodularity of the right pleural surface consistent with pleural passes. 3. Right upper lobe pleural based mass. 4. Right hilar and mediastinal adenopathy. 5. Patchy areas of hazy ground-glass density in the left lung, likely inflammatory/infectious. 6. Two adjacent small nodules in the right upper abdomen, indeterminate, possibly metastatic disease"  Chest x-ray done on 05/21/2018 revealed "Decreased volume of right pleural effusion following thoracentesis. No evidence of pneumothorax".  (Chest x-ray was done postthoracentesis)  Cytology result is said to reveal adenocarcinoma.  Discharge Exam: Blood pressure 102/70, pulse 83, temperature 98.2 F (36.8 C), temperature source Oral, resp. rate 18, height 6\' 3"  (1.905 m), weight 91 kg, SpO2 93 %.   Disposition: Discharge disposition: 01-Home or Self Care   Discharge Instructions    Diet - low sodium heart healthy   Complete by:  As directed    Increase activity slowly   Complete by:  As directed      Allergies as of 05/27/2018      Reactions   Penicillin G    Penicillins Swelling   Has patient had a PCN reaction causing immediate rash, facial/tongue/throat swelling, SOB or lightheadedness with hypotension: No Has patient had a PCN reaction causing severe rash involving mucus membranes or skin necrosis: No Has patient had a PCN reaction that required hospitalization: No Has patient had a PCN reaction occurring within the last 10 years: No If all of the above answers are "NO", then may proceed with Cephalosporin use.  Medication List    STOP taking these medications   furosemide 20 MG tablet Commonly known as:  LASIX   oxymorphone 10 MG tablet Commonly known  as:  OPANA Replaced by:  morphine 30 MG 12 hr tablet     TAKE these medications   albuterol 108 (90 Base) MCG/ACT inhaler Commonly known as:  PROVENTIL HFA;VENTOLIN HFA 2 puffs every 4 hours as needed only  if your can't catch your breath   benztropine 1 MG tablet Commonly known as:  COGENTIN Take 1 mg by mouth 2 (two) times daily.   cyclobenzaprine 10 MG tablet Commonly known as:  FLEXERIL Take 10 mg by mouth 3 (three) times daily as needed for muscle spasms.   DULoxetine 60 MG capsule Commonly known as:  CYMBALTA Take 60 mg by mouth daily.   morphine 30 MG 12 hr tablet Commonly known as:  MS CONTIN Take 1 tablet (30 mg total) by mouth every 12 (twelve) hours. Replaces:  oxymorphone 10 MG tablet   OLANZapine 15 MG tablet Commonly known as:  ZYPREXA Take 1 tablet (15 mg total) by mouth at bedtime. What changed:  how much to take   Oxcarbazepine 300 MG tablet Commonly known as:  TRILEPTAL Take 300 mg by mouth 2 (two) times daily.   oxyCODONE 5 MG immediate release tablet Commonly known as:  Oxy IR/ROXICODONE Take 1 tablet (5 mg total) by mouth every 6 (six) hours as needed for breakthrough pain.   senna-docusate 8.6-50 MG tablet Commonly known as:  Senokot-S Take 1 tablet by mouth at bedtime as needed for mild constipation.   traZODone 100 MG tablet Commonly known as:  DESYREL Take 100 mg by mouth at bedtime.            Durable Medical Equipment  (From admission, onward)         Start     Ordered   05/27/18 1452  DME Oxygen  Once    Question Answer Comment  Mode or (Route) Nasal cannula   Liters per Minute 2   Frequency Continuous (stationary and portable oxygen unit needed)   Oxygen conserving device Yes   Oxygen delivery system Gas      05/27/18 1455         Time spent discharging patient: 33 minutes.  SignedBonnell Public 05/27/2018, 2:56 PM

## 2018-07-04 LAB — ACID FAST CULTURE WITH REFLEXED SENSITIVITIES (MYCOBACTERIA): Acid Fast Culture: NEGATIVE

## 2018-07-25 DEATH — deceased

## 2019-12-02 IMAGING — CT CT CHEST W/ CM
2 of 6 series · 14 of 36 positions shown, 18 images · IV contrast (Omni 300)
Comparison: Chest radiograph dated 05/19/2018 and CT dated
07/26/2014

CLINICAL DATA: 62-year-old male with shortness of breath. Pleural
effusion. History of COPD and smoking.

EXAM:
CT CHEST WITH CONTRAST
TECHNIQUE: Multidetector CT imaging of the chest was performed during
intravenous contrast administration.
CONTRAST:  75mL OMNIPAQUE IOHEXOL 300 MG/ML  SOLN

[Series 3: chest with 2mm st · axial · 0.85mm/px · z∈[+1038,+1402]mm · 13 of 210 slices shown, 17 images]
[im 14/210  mediastinal]
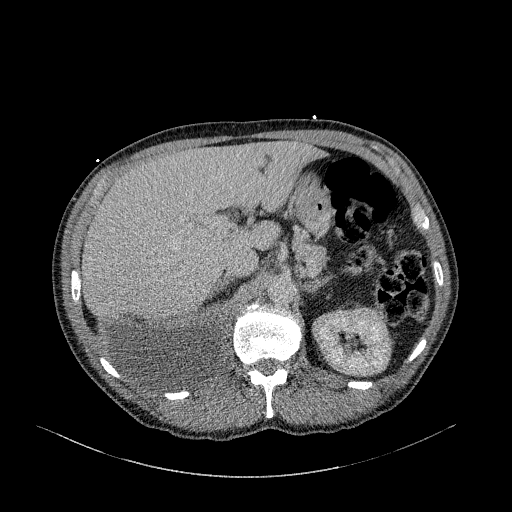
[im 14/210  lung]
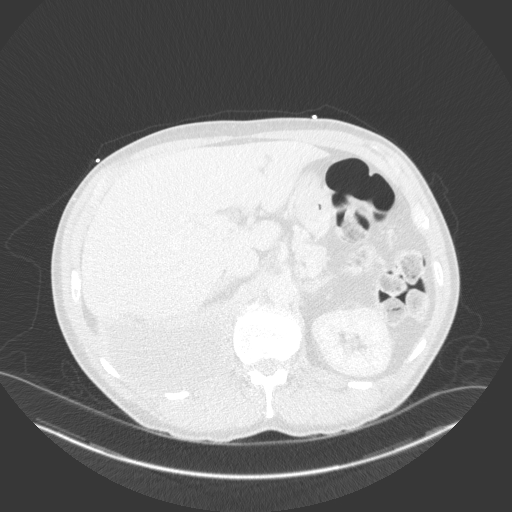
[im 28/210  lung]
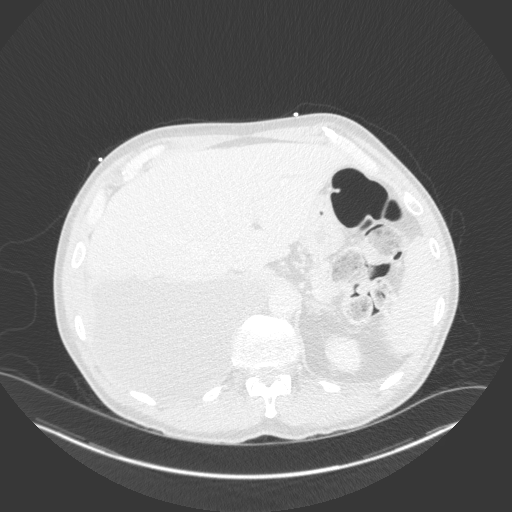
[im 42/210  lung]
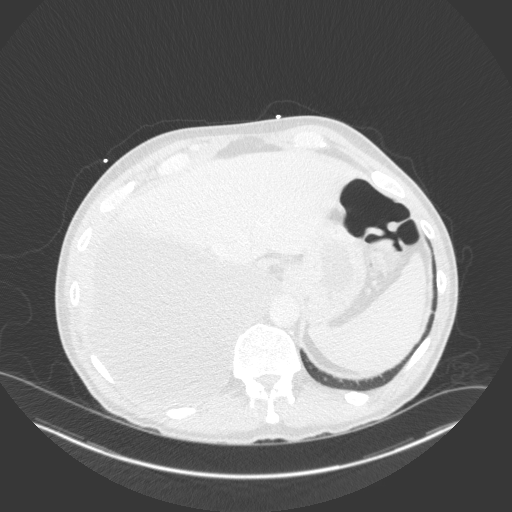
[im 56/210  lung]
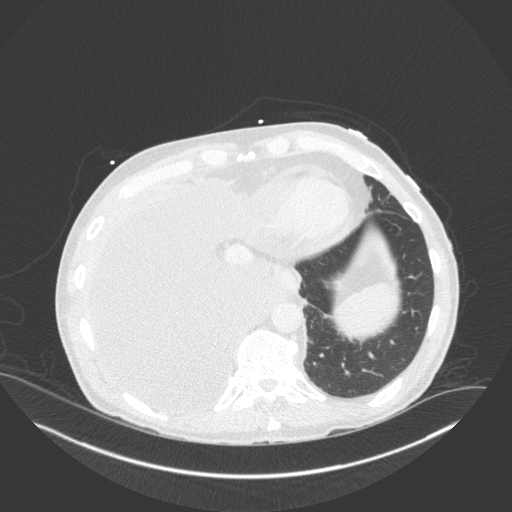
[im 70/210  mediastinal]
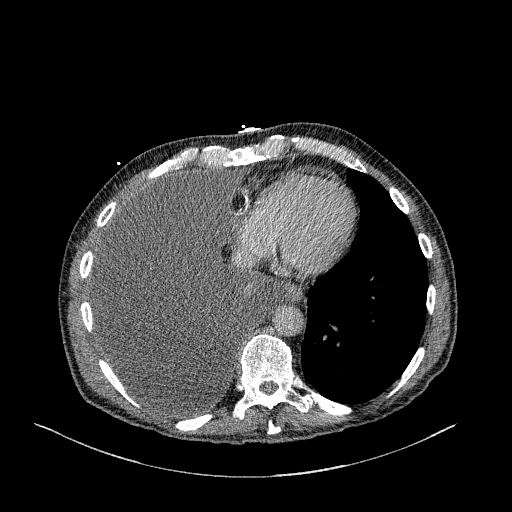
[im 70/210  lung]
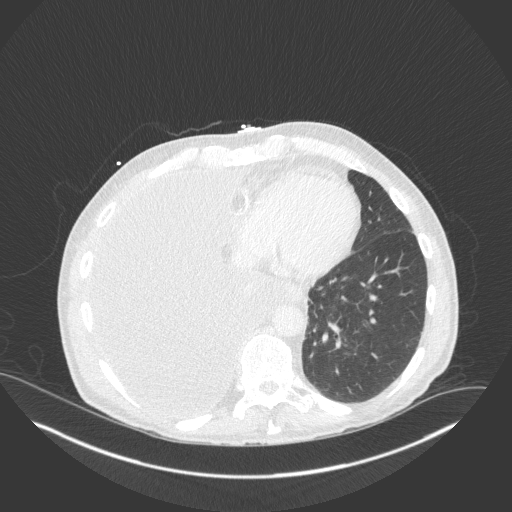
[im 84/210  lung]
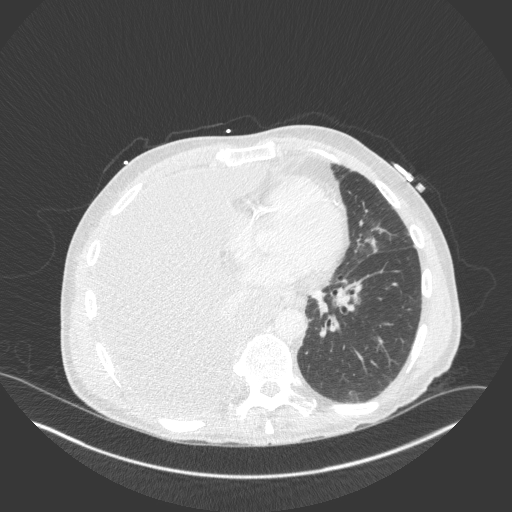
[im 112/210  lung]
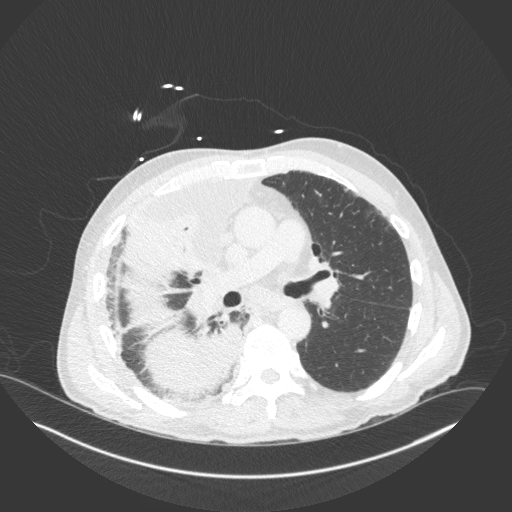
[im 126/210  lung]
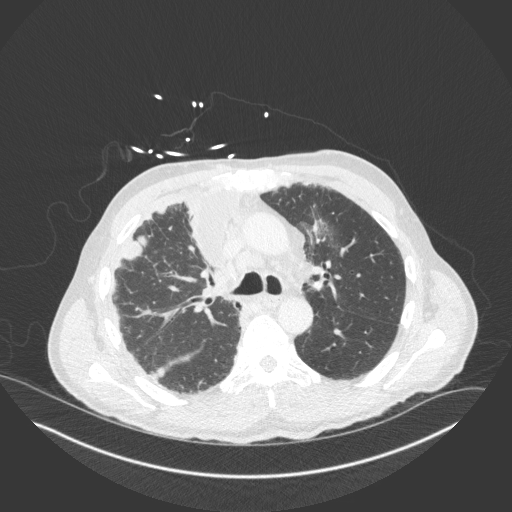
[im 140/210  mediastinal]
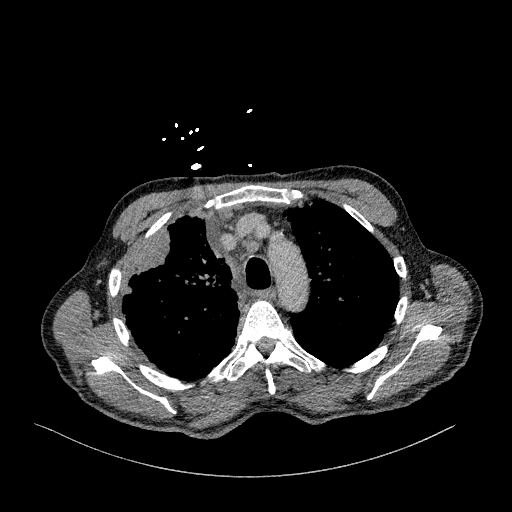
[im 140/210  lung]
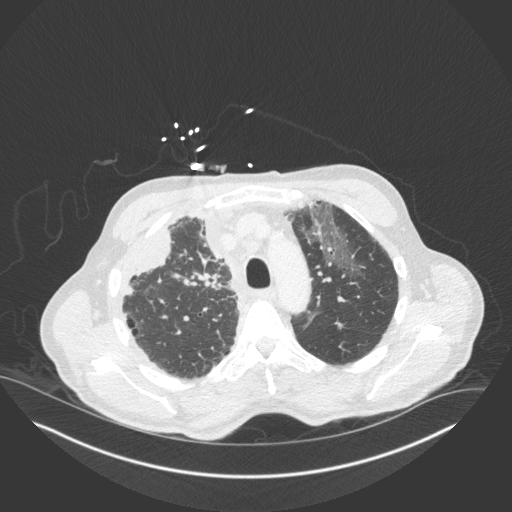
[im 154/210  lung]
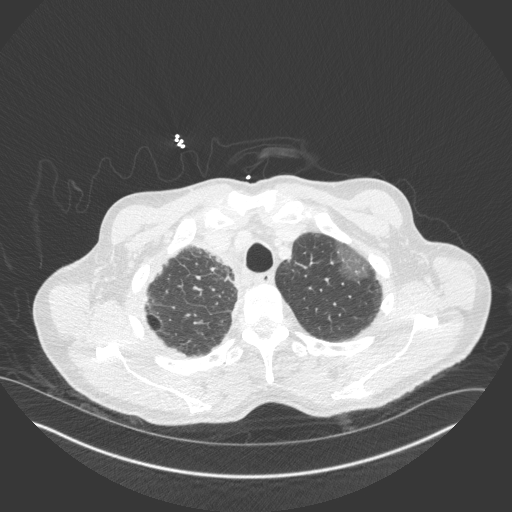
[im 168/210  lung]
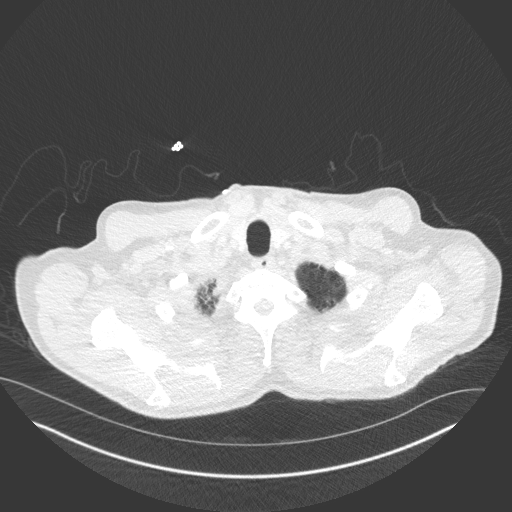
[im 182/210  lung]
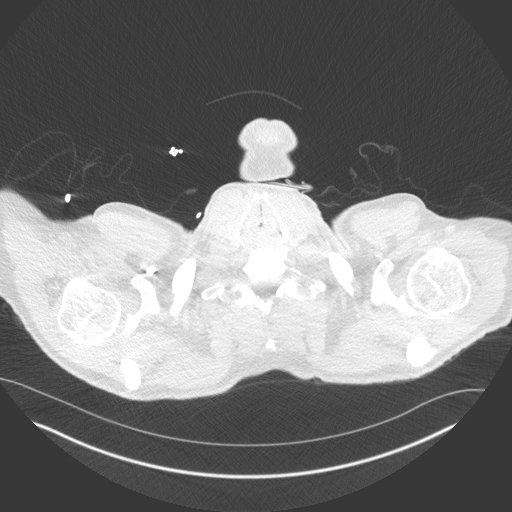
[im 196/210  mediastinal]
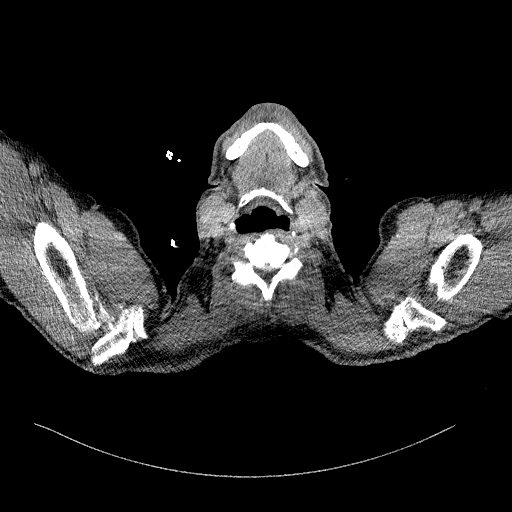
[im 196/210  lung]
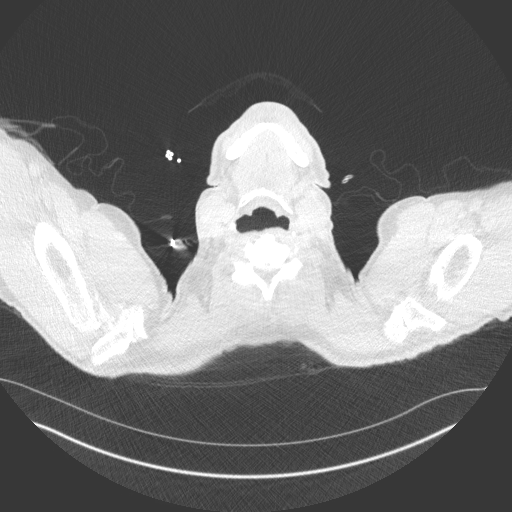

[Series 6: chest with 3mm st cor · coronal · 0.72mm/px · 1 of 101 slices shown]
[im 51/101  lung]
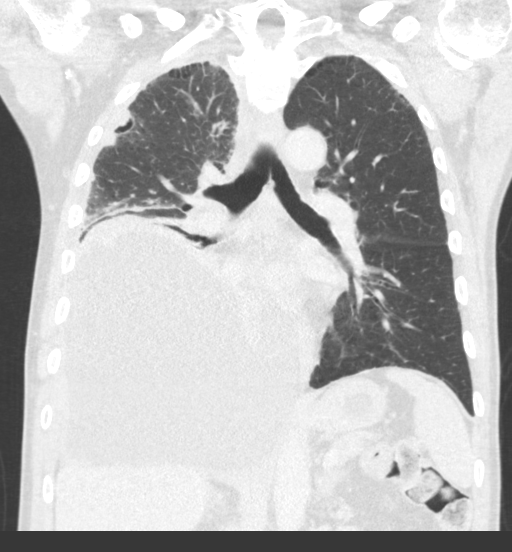

[14 of 36 positions shown; findings below may reference images not displayed]

FINDINGS: Cardiovascular: There is no cardiomegaly. Small pericardial effusion
measuring 7 mm in thickness. There is multi vessel coronary vascular
calcification. The thoracic aorta is unremarkable. Evaluation of the
pulmonary arteries is very limited due to suboptimal opacification.
The main pulmonary trunk and central pulmonary arteries are patent.

Mediastinum/Nodes: Multiple mediastinal adenopathy as well as right
hilar adenopathy including an 18 mm subcarinal and a 19 mm right
suprahilar lymph node in short axis. There is an enlarged lymph node
to the right of the upper esophagus in the upper mediastinum (series
3, image 51).

Lungs/Pleura: There is a large right-sided pleural effusion. There
is near complete collapse and consolidative changes of the right
middle lobe and majority of right lower lobe. There is diffuse
thickening and nodularity of the right pleural surface consistent
with pleural metastatic disease. There is a 2.0 x 4.1 cm pleural
based mass in the right upper lobe anteriorly. Patchy area of
ground-glass density in the left upper lobe as well as lingula and
left lower lobe noted which may be infectious or inflammatory. There
is no pleural effusion on the left. No pneumothorax. The central
airways are patent.

Upper Abdomen: There are 2 adjacent nodular densities in the right
upper abdomen measuring up to 7 mm, indeterminate, possibly
metastatic disease. Partially visualized 4.7 cm cystic structure in
the right upper quadrant, likely a renal cyst.

Musculoskeletal: No acute osseous pathology. No suspicious osseous
lesions.
IMPRESSION: 1. Large right pleural effusion most consistent with malignant
effusion. There is associated compressive atelectasis of the
majority of the right middle and right lower lobe.
2. Diffuse thickening and nodularity of the right pleural surface
consistent with pleural passes.
3. Right upper lobe pleural based mass.
4. Right hilar and mediastinal adenopathy.
5. Patchy areas of hazy ground-glass density in the left lung,
likely inflammatory/infectious.
6. Two adjacent small nodules in the right upper abdomen,
indeterminate, possibly metastatic disease.
